# Patient Record
Sex: Female | Born: 1985
Health system: Southern US, Community
[De-identification: ages and names within clinical notes are randomized; demographics above are authoritative.]

## PROBLEM LIST (undated history)

## (undated) DIAGNOSIS — I10 Essential (primary) hypertension: Secondary | ICD-10-CM

---

## 2019-03-10 ENCOUNTER — Ambulatory Visit: Payer: No Typology Code available for payment source | Admitting: Family Medicine

## 2019-03-22 ENCOUNTER — Ambulatory Visit (INDEPENDENT_AMBULATORY_CARE_PROVIDER_SITE_OTHER): Payer: No Typology Code available for payment source | Admitting: Family Medicine

## 2019-03-22 ENCOUNTER — Encounter: Payer: Self-pay | Admitting: Family Medicine

## 2019-03-22 DIAGNOSIS — Z7182 Exercise counseling: Secondary | ICD-10-CM | POA: Diagnosis not present

## 2019-03-22 DIAGNOSIS — Z713 Dietary counseling and surveillance: Secondary | ICD-10-CM | POA: Diagnosis not present

## 2019-03-22 DIAGNOSIS — Z0000 Encounter for general adult medical examination without abnormal findings: Secondary | ICD-10-CM

## 2019-03-22 NOTE — Progress Notes (Signed)
   Chief Complaint:  Janice Matthews is a 33 y.o. female who presents today for a virtual office visit to establish care.  Assessment/Plan:  Preventative Healthcare Discussed preventive health care including diet, exercise, nutrition, activity, sick care, and cancer screening.    Subjective:  HPI:  Patient has no concerns today.  Her medical history is significant for 2 normal pregnancies.  She tries to be active and tries to walk every day.  She is also very active with her toddlers.  She tries to eat overall healthy diet with plenty of fruits and vegetables.  ROS: No reported cough.       Objective/Observations  Physical Exam: Gen: NAD, resting comfortably Pulm: Normal work of breathing Neuro: Grossly normal, moves all extremities Psych: Normal affect and thought content  Virtual Visit via Video   I connected with Cheyenne Adas on 03/22/19 at  9:20 AM EDT by a video enabled telemedicine application and verified that I am speaking with the correct person using two identifiers. I discussed the limitations of evaluation and management by telemedicine and the availability of in person appointments. The patient expressed understanding and agreed to proceed.   Patient location: Home Provider location: [place] participating in the virtual visit: Myself and Patient  Time Spent: I spent 20 minutes on this patient encounter, with more than half spent on counseling for preventative health care.      Algis Greenhouse. Jerline Pain, MD 03/22/2019 9:30 AM

## 2019-06-03 NOTE — L&D Delivery Note (Signed)
Delivery Note Pt progressed steadily through labor, using fentanyl x1 for pain control.  After about a 20  Minute 2nd stage, at 1:39 AM a viable female was delivered via Vaginal, Spontaneous (Presentation: Left Occiput Anterior). The shoulders were not forthcoming, so the posterior (right) axilla was grasped with my index finger, and the baby was rotated clockwise into the oblique diameter.  At this point, the (now) anterior shoulder was released, and the baby delivered.  At no time was any traction placed on the baby's head.    APGAR: 8, 9; weight pending. After 1 minute, the cord was clamped and cut. 40 units of pitocin diluted in 1000cc LR was infused rapidly IV.  The placenta separated spontaneously and delivered via CCT and maternal pushing effort.  It was inspected and appears to be intact with a 3 VC.   Anesthesia: None Episiotomy: None Lacerations: None Suture Repair:  Est. Blood Loss (mL): 150  Mom to postpartum.  Baby to Couplet care / Skin to Skin.  Janice Matthews 02/24/2020, 2:12 AM

## 2019-07-07 ENCOUNTER — Other Ambulatory Visit: Payer: Self-pay

## 2019-07-08 ENCOUNTER — Encounter: Payer: Self-pay | Admitting: Family Medicine

## 2019-07-08 ENCOUNTER — Ambulatory Visit (INDEPENDENT_AMBULATORY_CARE_PROVIDER_SITE_OTHER): Payer: No Typology Code available for payment source | Admitting: Family Medicine

## 2019-07-08 VITALS — BP 122/74 | HR 95 | Temp 98.0°F | Ht 65.0 in | Wt 154.2 lb

## 2019-07-08 DIAGNOSIS — O210 Mild hyperemesis gravidarum: Secondary | ICD-10-CM

## 2019-07-08 DIAGNOSIS — Z3490 Encounter for supervision of normal pregnancy, unspecified, unspecified trimester: Secondary | ICD-10-CM

## 2019-07-08 DIAGNOSIS — N926 Irregular menstruation, unspecified: Secondary | ICD-10-CM | POA: Insufficient documentation

## 2019-07-08 LAB — POCT URINE PREGNANCY: Preg Test, Ur: POSITIVE — AB

## 2019-07-08 NOTE — Patient Instructions (Signed)
It was very nice to see you today!  Congratulations!  Please let us know if you need any further assistance.   Take care, Dr Jimmey Ralph  Please try these tips to maintain a healthy lifestyle:   Eat at least 3 REAL meals and 1-2 snacks per day.  Aim for no more than 5 hours between eating.  If you eat breakfast, please do so within one hour of getting up.    Each meal should contain half fruits/vegetables, one quarter protein, and one quarter carbs (no bigger than a computer mouse)   Cut down on sweet beverages. This includes juice, soda, and sweet tea.     Drink at least 1 glass of water with each meal and aim for at least 8 glasses per day   Exercise at least 150 minutes every week.

## 2019-07-08 NOTE — Progress Notes (Signed)
   Janice Matthews is a 34 y.o. female who presents today for an office visit.  Assessment/Plan:  Pregnancy / Morning Sickness Urine pregnancy test positive.  Has follow-up with midwives already scheduled.  She will continue taking Unisom for her morning sickness.    Subjective:  HPI:  Patient is here today to confirm pregnancy. Thinks that she is about [redacted] weeks along.  Has been having some morning sickness and food aversion.  Also some fatigue.  Is been taking Unisom which is helped with morning sickness.  Will be following up with midwives.  Has had 2 previous normal pregnancies.       Objective:  Physical Exam: BP 122/74   Pulse 95   Temp 98 F (36.7 C)   Ht 5\' 5"  (1.651 m)   Wt 154 lb 4 oz (70 kg)   SpO2 99%   BMI 25.67 kg/m   Gen: No acute distress, resting comfortably Psych: Normal affect and thought content      Thermon Zulauf M. , MD 07/08/2019 8:28 AM

## 2019-07-25 ENCOUNTER — Ambulatory Visit (INDEPENDENT_AMBULATORY_CARE_PROVIDER_SITE_OTHER): Payer: No Typology Code available for payment source | Admitting: *Deleted

## 2019-07-25 ENCOUNTER — Other Ambulatory Visit: Payer: Self-pay

## 2019-07-25 VITALS — Wt 150.0 lb

## 2019-07-25 DIAGNOSIS — Z3400 Encounter for supervision of normal first pregnancy, unspecified trimester: Secondary | ICD-10-CM

## 2019-07-25 DIAGNOSIS — Z3480 Encounter for supervision of other normal pregnancy, unspecified trimester: Secondary | ICD-10-CM | POA: Insufficient documentation

## 2019-07-25 MED ORDER — BLOOD PRESSURE MONITOR AUTOMAT DEVI: 1.0000 | Freq: Every day | 0 refills | Status: DC

## 2019-07-25 NOTE — Progress Notes (Signed)
  Virtual Visit via Telephone Note  I connected with Janice Matthews on 07/25/19 at  1:30 PM EST by telephone and verified that I am speaking with the correct person using two identifiers.  Location: Patient: Janice Matthews MRN: [id number] Provider: Clovis Pu, RN   I discussed the limitations, risks, security and privacy concerns of performing an evaluation and management service by telephone and the availability of in person appointments. I also discussed with the patient that there may be a patient responsible charge related to this service. The patient expressed understanding and agreed to proceed.   History of Present Illness: PRENATAL INTAKE SUMMARY  Janice Matthews presents today New OB Nurse Interview.  OB History    Gravida  3   Para  2   Term  2   Preterm      AB      Living  2     SAB      TAB      Ectopic      Multiple      Live Births  2          I have reviewed the patient's medical, obstetrical, social, and family histories, medications, and available lab results.  SUBJECTIVE She has no unusual complaints. Reported history of PIH and taking ASA.   Observations/Objective: Initial nurse interview for history/labs (New OB)  EDD: 02/23/2020 by LMP GA: [redacted]w[redacted]d G3P2002 FHT: non face to face interview  GENERAL APPEARANCE: non face to face interview  Assessment and Plan: Normal pregnancy Prenatal care- [facility] Labs to be completed at next visit with midwife Patient to sign up for Babyscripts Rx for BP monitor sent to [facility] Continue PNV Next appointment 08/05/2019  Follow Up Instructions:   I discussed the assessment and treatment plan with the patient. The patient was provided an opportunity to ask questions and all were answered. The patient agreed with the plan and demonstrated an understanding of the instructions.   The patient was advised to call back or seek an in-person evaluation if the symptoms worsen or if the  condition fails to improve as anticipated.  I provided 15 minutes of non-face-to-face time during this encounter.   Clovis Pu, RN

## 2019-08-05 ENCOUNTER — Other Ambulatory Visit: Payer: Self-pay

## 2019-08-05 ENCOUNTER — Inpatient Hospital Stay (HOSPITAL_COMMUNITY)
Admission: AD | Admit: 2019-08-05 | Discharge: 2019-08-05 | Disposition: A | Discharge status: Home / Self Care | Payer: No Typology Code available for payment source | Attending: Obstetrics & Gynecology | Admitting: Obstetrics & Gynecology

## 2019-08-05 ENCOUNTER — Telehealth: Payer: Self-pay

## 2019-08-05 ENCOUNTER — Inpatient Hospital Stay (HOSPITAL_COMMUNITY): Payer: No Typology Code available for payment source

## 2019-08-05 ENCOUNTER — Encounter (HOSPITAL_COMMUNITY): Payer: Self-pay | Admitting: Obstetrics & Gynecology

## 2019-08-05 ENCOUNTER — Ambulatory Visit (INDEPENDENT_AMBULATORY_CARE_PROVIDER_SITE_OTHER): Payer: No Typology Code available for payment source

## 2019-08-05 VITALS — BP 127/89 | HR 71 | Wt 153.8 lb

## 2019-08-05 DIAGNOSIS — Z3A11 11 weeks gestation of pregnancy: Secondary | ICD-10-CM

## 2019-08-05 DIAGNOSIS — O358XX0 Maternal care for other (suspected) fetal abnormality and damage, not applicable or unspecified: Secondary | ICD-10-CM

## 2019-08-05 DIAGNOSIS — Z8759 Personal history of other complications of pregnancy, childbirth and the puerperium: Secondary | ICD-10-CM | POA: Insufficient documentation

## 2019-08-05 DIAGNOSIS — Z3480 Encounter for supervision of other normal pregnancy, unspecified trimester: Secondary | ICD-10-CM

## 2019-08-05 DIAGNOSIS — K5900 Constipation, unspecified: Secondary | ICD-10-CM

## 2019-08-05 DIAGNOSIS — O368310 Maternal care for abnormalities of the fetal heart rate or rhythm, first trimester, not applicable or unspecified: Secondary | ICD-10-CM | POA: Insufficient documentation

## 2019-08-05 DIAGNOSIS — IMO0002 Reserved for concepts with insufficient information to code with codable children: Secondary | ICD-10-CM

## 2019-08-05 DIAGNOSIS — O368390 Maternal care for abnormalities of the fetal heart rate or rhythm, unspecified trimester, not applicable or unspecified: Secondary | ICD-10-CM

## 2019-08-05 DIAGNOSIS — O99611 Diseases of the digestive system complicating pregnancy, first trimester: Secondary | ICD-10-CM

## 2019-08-05 HISTORY — DX: Essential (primary) hypertension: I10

## 2019-08-05 LAB — CBC
HCT: 40.1 % (ref 36.0–46.0)
Hemoglobin: 14.3 g/dL (ref 12.0–15.0)
MCH: 30.4 pg (ref 26.0–34.0)
MCHC: 35.7 g/dL (ref 30.0–36.0)
MCV: 85.1 fL (ref 80.0–100.0)
Platelets: 307 10*3/uL (ref 150–400)
RBC: 4.71 MIL/uL (ref 3.87–5.11)
RDW: 12.3 % (ref 11.5–15.5)
WBC: 8.7 10*3/uL (ref 4.0–10.5)
nRBC: 0 % (ref 0.0–0.2)

## 2019-08-05 LAB — COMPREHENSIVE METABOLIC PANEL
ALT: 16 U/L (ref 0–44)
AST: 18 U/L (ref 15–41)
Albumin: 3.8 g/dL (ref 3.5–5.0)
Alkaline Phosphatase: 59 U/L (ref 38–126)
Anion gap: 10 (ref 5–15)
BUN: 8 mg/dL (ref 6–20)
CO2: 24 mmol/L (ref 22–32)
Calcium: 9.4 mg/dL (ref 8.9–10.3)
Chloride: 102 mmol/L (ref 98–111)
Creatinine, Ser: 0.76 mg/dL (ref 0.44–1.00)
GFR calc Af Amer: >60 mL/min (ref >60)
GFR calc non Af Amer: >60 mL/min (ref >60)
Glucose, Bld: 99 mg/dL (ref 70–99)
Potassium: 3.9 mmol/L (ref 3.5–5.1)
Sodium: 136 mmol/L (ref 135–145)
Total Bilirubin: 0.5 mg/dL (ref 0.3–1.2)
Total Protein: 6.2 g/dL — ABNORMAL LOW (ref 6.5–8.1)

## 2019-08-05 LAB — ABO/RH: ABO/RH(D): O POS

## 2019-08-05 LAB — HCG, QUANTITATIVE, PREGNANCY: hCG, Beta Chain, Quant, S: 117137 m[IU]/mL — ABNORMAL HIGH (ref <5)

## 2019-08-05 MED ORDER — POLYETHYLENE GLYCOL 3350 17 G PO PACK: 17.0000 g | PACK | Freq: Every day | ORAL | 2 refills | Status: DC | PRN

## 2019-08-05 MED ORDER — ASPIRIN EC 81 MG PO TBEC: 81.0000 mg | DELAYED_RELEASE_TABLET | Freq: Every day | ORAL | 2 refills | Status: DC

## 2019-08-05 NOTE — Telephone Encounter (Signed)
Attempted to call patient regarding follow up and completion of NOB labs after US reveals SIUP with dates c/w LMP. Phone straight to voicemail and no message left.  Will send mychart message to patient and staff.  Cherre Robins MSN, CNM Advanced Practice Provider, [facility] [employer] [facility]

## 2019-08-05 NOTE — Progress Notes (Signed)
Last pap 3 years ago- normal

## 2019-08-05 NOTE — Progress Notes (Signed)
Subjective:   Janice Matthews is a 34 y.o. G3P2002 at [redacted]w[redacted]d by Definite LMP of 05/18/2020 is being seen today for her first obstetrical visit. She denies any significant medical history and her obstetrical history is significant for pregnancy induced hypertension at 39 weeks during her first pregnancy.  She reports she was on low-dose baby aspirin for her 2nd pregnancy and did not have any complications.  She does report that baby was post-dates and she was induced.  Patient expresses desire for spontaneous labor with this pregnancy! Pregnancy history fully reviewed. Patient does intend to breast feed.   Patient requests provider recommendation regarding Covid vaccine during pregnancy. Patient states her last appt (2/22) she gave the wrong weight and instead of 142lbs, it should be 150lbs.    Patient reports some nausea and fatigue that has resolved with spontaneously, but was being managed with 1/2 tablet of unisom.  However patient reports some lingering results that resulted in discontinuation.  Patient reports some constipation and requests miralax.  She states she has bowel movements every 2-3 days that are easy to pass.   Patient denies urinary and vaginal concerns.  Patient reports her last pap was between 3-5 years ago, but was normal.    Patient is married to [place] who is her support person.  Patient is from [place] and reports she has no family in [place] other than her husband and two small children.    HISTORY: OB History  Gravida Para Term Preterm AB Living  3 2 2  0 0 2  SAB TAB Ectopic Multiple Live Births  0 0 0 0 2    # Outcome Date GA Lbr Len/2nd Weight Sex Delivery Anes PTL Lv  3 Current           2 Term 12/20/16 [redacted]w[redacted]d  8 lb (3.629 kg) F Vag-Spont  N LIV  1 Term 10/16/14 [redacted]w[redacted]d  8 lb 2 oz (3.685 kg) M Vag-Spont  N LIV    Last pap smear was done 3-5 years ago and was normal  No past medical history on file. No past surgical history on file. Family History    Problem Relation Age of Onset  . Diabetes Mother   . Thyroid disease Mother    Social History   Tobacco Use  . Smoking status: Never Smoker  . Smokeless tobacco: Never Used  Substance Use Topics  . Alcohol use: Yes    Comment: occ  . Drug use: Never   No Known Allergies Current Outpatient Medications on File Prior to Visit  Medication Sig Dispense Refill  . Blood Pressure Monitoring (BLOOD PRESSURE MONITOR AUTOMAT) DEVI 1 Device by Does not apply route daily. Automatic blood pressure cuff regular size. To monitor blood pressure regularly at home. ICD-10 code: O09.90 1 each 0  . Prenatal Vit-Fe Fumarate-FA (MULTIVITAMIN-PRENATAL) 27-0.8 MG TABS tablet Take 1 tablet by mouth daily at 12 noon.     No current facility-administered medications on file prior to visit.    Review of Systems Pertinent items noted in HPI and remainder of comprehensive ROS otherwise negative.  Exam   Vitals:   08/05/19 0948  BP: 127/89  Pulse: 71  Weight: 153 lb 12.8 oz (69.8 kg)      Physical Exam Constitutional:      Appearance: Normal appearance.  HENT:     Head: Normocephalic and atraumatic.  Eyes:     Conjunctiva/sclera: Conjunctivae normal.  Neck:     Trachea: Trachea normal.  Cardiovascular:  Rate and Rhythm: Regular rhythm.  Pulmonary:     Effort: Pulmonary effort is normal. No respiratory distress.  Abdominal:     General: Abdomen is flat.     Palpations: Abdomen is soft.     Tenderness: There is no abdominal tenderness.  Musculoskeletal:        General: Normal range of motion.     Cervical back: Normal range of motion.  Neurological:     Mental Status: She is alert and oriented to person, place, and time.  Skin:    General: Skin is warm and dry.  Psychiatric:        Mood and Affect: Mood normal.        Behavior: Behavior normal.        Thought Content: Thought content normal.     Assessment:   34 y.o. year old G3P2002 Patient Active Problem List   Diagnosis  Date Noted  . History of gestational hypertension 08/05/2019  . Supervision of other normal pregnancy, antepartum 07/25/2019     Plan:  1. Supervision of other normal pregnancy, antepartum -Congratulations given and patient welcomed to practice. -Questions and concerns addressed. -Informed that research has just began for Covid vaccinations in pregnancy.  Provider has no formal recommendation regarding vaccination.  Encouraged to continue social distancing, masking, and limiting exposure to others as much as possible.  -Reviewed hospital protocol for -Discussed usage of Babyscripts and virtual visits as additional source of managing and completing PN visits in midst of coronavirus.   -Anticipatory guidance for prenatal visits including labs, ultrasounds, and testing -Genetic Screening discussed, NIPS: requested. -Encouraged to complete MyChart Registration for her ability to review results, send requests, and have questions addressed.  -Discussed estimated due date of Sept 23, 2021. -Ultrasound discussed; fetal anatomic survey: ordered. -Encouraged to seek out care at office or emergency room for urgent and/or emergent concerns. -Educated on the nature of [place] [facility] with multiple MDs and other Advanced Practice Providers was explained to patient; also emphasized that residents, students are part of our team. Informed of her right to refuse care as she deems appropriate.  -No questions or concerns.   2. History of gestational hypertension -Discussed initiation of low dose aspirin in one week for history of GHTN. -Patient has BP cuff at home.   3. Constipation during pregnancy in first trimester -Rx for Miralax sent to pharmacy on file.   4. Absence of fetal heart activity -Attempted by CMA without success. -Provider attempted by doppler without success. -BSUS performed and no fetus visualized. -Patient informed of provider inability to visualize  uterine contents including anticipated fetus.  -Patient given option to complete physical exam or report to [facility] for further evaluation. -Patient opts to go to [facility] -S. Reita Cliche, CNM contacted and informed of patient status and anticipated arrival.  -Patient informed that provider would review chart and coordinate care as appropriate.   Problem list reviewed and updated. Routine obstetric precautions reviewed.  Orders Placed This Encounter  Procedures  . [place] MFM OB DETAIL +14 WK    Standing Status:   Future    Standing Expiration Date:   10/04/2020    Order Specific Question:   Reason for Exam (SYMPTOM  OR DIAGNOSIS REQUIRED)    Answer:   Anatomy    Order Specific Question:   Preferred Location    Answer:   [facility]    No follow-ups on file.  Cherre Robins, CNM 08/05/2019 9:55 AM  Addendum (11:31 AM) All OB labs cancelled. Medications remain at pharmacy. MFM [place] cancelled.

## 2019-08-05 NOTE — Discharge Instructions (Signed)
First Trimester of Pregnancy  The first trimester of pregnancy is from week 1 until the end of week 13 (months 1 through 3). During this time, your baby will begin to develop inside you. At 6-8 weeks, the eyes and face are formed, and the heartbeat can be seen on ultrasound. At the end of 12 weeks, all the baby's organs are formed. Prenatal care is all the medical care you receive before the birth of your baby. Make sure you get good prenatal care and follow all of your doctor's instructions. Follow these instructions at home: Medicines  Take over-the-counter and prescription medicines only as told by your doctor. Some medicines are safe and some medicines are not safe during pregnancy.  Take a prenatal vitamin that contains at least 600 micrograms (mcg) of folic acid.  If you have trouble pooping (constipation), take medicine that will make your stool soft (stool softener) if your doctor approves. Eating and drinking   Eat regular, healthy meals.  Your doctor will tell you the amount of weight gain that is right for you.  Avoid raw meat and uncooked cheese.  If you feel sick to your stomach (nauseous) or throw up (vomit): ? Eat 4 or 5 small meals a day instead of 3 large meals. ? Try eating a few soda crackers. ? Drink liquids between meals instead of during meals.  To prevent constipation: ? Eat foods that are high in fiber, like fresh fruits and vegetables, whole grains, and beans. ? Drink enough fluids to keep your pee (urine) clear or pale yellow. Activity  Exercise only as told by your doctor. Stop exercising if you have cramps or pain in your lower belly (abdomen) or low back.  Do not exercise if it is too hot, too humid, or if you are in a place of great height (high altitude).  Try to avoid standing for long periods of time. Move your legs often if you must stand in one place for a long time.  Avoid heavy lifting.  Wear low-heeled shoes. Sit and stand up  straight.  You can have sex unless your doctor tells you not to. Relieving pain and discomfort  Wear a good support bra if your breasts are sore.  Take warm water baths (sitz baths) to soothe pain or discomfort caused by hemorrhoids. Use hemorrhoid cream if your doctor says it is okay.  Rest with your legs raised if you have leg cramps or low back pain.  If you have puffy, bulging veins (varicose veins) in your legs: ? Wear support hose or compression stockings as told by your doctor. ? Raise (elevate) your feet for 15 minutes, 3-4 times a day. ? Limit salt in your food. Prenatal care  Schedule your prenatal visits by the twelfth week of pregnancy.  Write down your questions. Take them to your prenatal visits.  Keep all your prenatal visits as told by your doctor. This is important. Safety  Wear your seat belt at all times when driving.  Make a list of emergency phone numbers. The list should include numbers for family, friends, the hospital, and police and fire departments. General instructions  Ask your doctor for a referral to a local prenatal class. Begin classes no later than at the start of month 6 of your pregnancy.  Ask for help if you need counseling or if you need help with nutrition. Your doctor can give you advice or tell you where to go for help.  Do not use hot tubs, steam   rooms, or saunas.  Do not douche or use tampons or scented sanitary pads.  Do not cross your legs for long periods of time.  Avoid all herbs and alcohol. Avoid drugs that are not approved by your doctor.  Do not use any tobacco products, including cigarettes, chewing tobacco, and electronic cigarettes. If you need help quitting, ask your doctor. You may get counseling or other support to help you quit.  Avoid cat litter boxes and soil used by cats. These carry germs that can cause birth defects in the baby and can cause a loss of your baby (miscarriage) or stillbirth.  Visit your dentist.  At home, brush your teeth with a soft toothbrush. Be gentle when you floss. Contact a doctor if:  You are dizzy.  You have mild cramps or pressure in your lower belly.  You have a nagging pain in your belly area.  You continue to feel sick to your stomach, you throw up, or you have watery poop (diarrhea).  You have a bad smelling fluid coming from your vagina.  You have pain when you pee (urinate).  You have increased puffiness (swelling) in your face, hands, legs, or ankles. Get help right away if:  You have a fever.  You are leaking fluid from your vagina.  You have spotting or bleeding from your vagina.  You have very bad belly cramping or pain.  You gain or lose weight rapidly.  You throw up blood. It may look like coffee grounds.  You are around people who have German measles, fifth disease, or chickenpox.  You have a very bad headache.  You have shortness of breath.  You have any kind of trauma, such as from a fall or a car accident. Summary  The first trimester of pregnancy is from week 1 until the end of week 13 (months 1 through 3).  To take care of yourself and your unborn baby, you will need to eat healthy meals, take medicines only if your doctor tells you to do so, and do activities that are safe for you and your baby.  Keep all follow-up visits as told by your doctor. This is important as your doctor will have to ensure that your baby is healthy and growing well. This information is not intended to replace advice given to you by your health care provider. Make sure you discuss any questions you have with your health care provider. Document Revised: 09/09/2018 Document Reviewed: 05/27/2016 Elsevier Patient Education  2020 [employer] Inc[employer]  Safe Medications in Pregnancy   Acne: Benzoyl Peroxide Salicylic Acid  Backache/Headache: Tylenol: 2 regular strength every 4 hours OR              2 Extra strength every 6  hours  Colds/Coughs/Allergies: Benadryl (alcohol free) 25 mg every 6 hours as needed Breath right strips Claritin Cepacol throat lozenges Chloraseptic throat spray Cold-Eeze- up to three times per day Cough drops, alcohol free Flonase (by prescription only) Guaifenesin Mucinex Robitussin DM (plain only, alcohol free) Saline nasal spray/drops Sudafed (pseudoephedrine) & Actifed ** use only after [redacted] weeks gestation and if you do not have high blood pressure Tylenol Vicks Vaporub Zinc lozenges Zyrtec   Constipation: Colace Ducolax suppositories Fleet enema Glycerin suppositories Metamucil Milk of magnesia Miralax Senokot Smooth move tea  Diarrhea: Kaopectate Imodium A-D  *NO pepto Bismol  Hemorrhoids: Anusol Anusol HC Preparation H Tucks  Indigestion: Tums Maalox Mylanta Zantac  Pepcid  Insomnia: Benadryl (alcohol free) 25mg every 6 hours as needed   Tylenol PM Unisom, no Gelcaps  Leg Cramps: Tums MagGel  Nausea/Vomiting:  Bonine Dramamine Emetrol Ginger extract Sea bands Meclizine  Nausea medication to take during pregnancy:  Unisom (doxylamine succinate 25 mg tablets) Take one tablet daily at bedtime. If symptoms are not adequately controlled, the dose can be increased to a maximum recommended dose of two tablets daily (1/2 tablet in the morning, 1/2 tablet mid-afternoon and one at bedtime). Vitamin B6 100mg tablets. Take one tablet twice a day (up to 200 mg per day).  Skin Rashes: Aveeno products Benadryl cream or 25mg every 6 hours as needed Calamine Lotion 1% cortisone cream  Yeast infection: Gyne-lotrimin 7 Monistat 7   **If taking multiple medications, please check labels to avoid duplicating the same active ingredients **take medication as directed on the label ** Do not exceed 4000 mg of tylenol in 24 hours **Do not take medications that contain aspirin or ibuprofen   

## 2019-08-05 NOTE — MAU Provider Note (Signed)
History     CSN: [id number]  Arrival date and time: 08/05/19 1053  First Provider Initiated Contact with Patient 08/05/19 1117     Chief Complaint  Patient presents with  . no fetal tones   HPI Janice Matthews is a 34 y.o. G3P2002 at [redacted]w[redacted]d who presents to MAU from [facility] for evaluation of absent fetal heart tones on doppler and informal bedside ultrasound. Patient denies pain or bleeding.   Ob history includes term SVD x 2.  OB History    Gravida  3   Para  2   Term  2   Preterm      AB      Living  2     SAB      TAB      Ectopic      Multiple      Live Births  2          Patient Active Problem List   Diagnosis Date Noted  . History of gestational hypertension 08/05/2019  . Supervision of other normal pregnancy, antepartum 07/25/2019   Past Medical History:  Diagnosis Date  . Hypertension     History reviewed. No pertinent surgical history.  Family History  Problem Relation Age of Onset  . Diabetes Mother   . Thyroid disease Mother     Social History   Tobacco Use  . Smoking status: Never Smoker  . Smokeless tobacco: Never Used  Substance Use Topics  . Alcohol use: Yes    Comment: occ  . Drug use: Never    Allergies: No Known Allergies  Medications Prior to Admission  Medication Sig Dispense Refill Last Dose  . Prenatal Vit-Fe Fumarate-FA (MULTIVITAMIN-PRENATAL) 27-0.8 MG TABS tablet Take 1 tablet by mouth daily at 12 noon.   08/04/2019 at Unknown time  . aspirin EC 81 MG tablet Take 1 tablet (81 mg total) by mouth daily. 60 tablet 2   . Blood Pressure Monitoring (BLOOD PRESSURE MONITOR AUTOMAT) DEVI 1 Device by Does not apply route daily. Automatic blood pressure cuff regular size. To monitor blood pressure regularly at home. ICD-10 code: O09.90 1 each 0   . polyethylene glycol (MIRALAX) 17 g packet Take 17 g by mouth daily as needed for mild constipation or moderate constipation. 14 each 2     Review of Systems   Constitutional: Positive for fatigue.  Gastrointestinal: Positive for nausea and vomiting. Negative for abdominal pain.  Genitourinary: Negative for dysuria, flank pain, vaginal bleeding and vaginal discharge.  Musculoskeletal: Negative for back pain.  All other systems reviewed and are negative.  Physical Exam   Blood pressure (!) 143/84, pulse (!) 121, temperature 98.9 F (37.2 C), temperature source Oral, resp. rate 16, height 5\' 5"  (1.651 m), weight 69.8 kg, last menstrual period 05/19/2019, SpO2 100 %.  Physical Exam  Nursing note and vitals reviewed. Constitutional: She is oriented to person, place, and time. She appears well-developed and well-nourished.  Cardiovascular: Normal rate.  Respiratory: Effort normal and breath sounds normal.  GI: Soft. Bowel sounds are normal. She exhibits no distension. There is no abdominal tenderness. There is no rebound and no guarding.  Musculoskeletal:        General: Normal range of motion.  Neurological: She is alert and oriented to person, place, and time.  Skin: Skin is warm and dry.  Psychiatric: She has a normal mood and affect. Her behavior is normal. Judgment and thought content normal.    MAU Course  Procedures  Patient Vitals for the past 24 hrs:  BP Temp Temp src Pulse Resp SpO2 Height Weight  08/05/19 1231 122/87 -- -- -- -- -- -- --  08/05/19 1101 (!) 143/84 98.9 F (37.2 C) Oral (!) 121 16 100 % 5\' 5"  (1.651 m) 69.8 kg   Results for orders placed or performed during the hospital encounter of 08/05/19 (from the past 24 hour(s))  CBC     Status: None   Collection Time: 08/05/19 11:41 AM  Result Value Ref Range   WBC 8.7 4.0 - 10.5 K/uL   RBC 4.71 3.87 - 5.11 MIL/uL   Hemoglobin 14.3 12.0 - 15.0 g/dL   HCT 10/05/19 09.2 - 33.0 %   MCV 85.1 80.0 - 100.0 fL   MCH 30.4 26.0 - 34.0 pg   MCHC 35.7 30.0 - 36.0 g/dL   RDW 07.6 22.6 - 33.3 %   Platelets 307 150 - 400 K/uL   nRBC 0.0 0.0 - 0.2 %  Comprehensive metabolic panel      Status: Abnormal   Collection Time: 08/05/19 11:41 AM  Result Value Ref Range   Sodium 136 135 - 145 mmol/L   Potassium 3.9 3.5 - 5.1 mmol/L   Chloride 102 98 - 111 mmol/L   CO2 24 22 - 32 mmol/L   Glucose, Bld 99 70 - 99 mg/dL   BUN 8 6 - 20 mg/dL   Creatinine, Ser 10/05/19 0.44 - 1.00 mg/dL   Calcium 9.4 8.9 - 6.25 mg/dL   Total Protein 6.2 (L) 6.5 - 8.1 g/dL   Albumin 3.8 3.5 - 5.0 g/dL   AST 18 15 - 41 U/L   ALT 16 0 - 44 U/L   Alkaline Phosphatase 59 38 - 126 U/L   Total Bilirubin 0.5 0.3 - 1.2 mg/dL   GFR calc non Af Amer >60 >60 mL/min   GFR calc Af Amer >60 >60 mL/min   Anion gap 10 5 - 15  ABO/Rh     Status: None   Collection Time: 08/05/19 11:41 AM  Result Value Ref Range   ABO/RH(D) O POS    No rh immune globuloin      NOT A RH IMMUNE GLOBULIN CANDIDATE, PT RH POSITIVE Performed at [facility], [address] [place], [place]   hCG, quantitative, pregnancy     Status: Abnormal   Collection Time: 08/05/19 11:41 AM  Result Value Ref Range   hCG, Beta Chain, Quant, S 117,137 (H) <5 mIU/mL   10/05/19 OB Comp Less 14 Wks  Result Date: 08/05/2019 CLINICAL DATA:  Pregnancy.  Difficulty evaluating fetal heart tones EXAM: OBSTETRIC <14 WK ULTRASOUND TECHNIQUE: Transabdominal ultrasound was performed for evaluation of the gestation as well as the maternal uterus and adnexal regions. COMPARISON:  None. FINDINGS: Intrauterine gestational sac: Single Yolk sac:  Visualized. Embryo:  Visualized. Cardiac Activity: Visualized. Heart Rate: 157 bpm CRL:   50.7 mm   11 w 5 d                  10/05/2019 EDC: 02/19/2020 Subchorionic hemorrhage:  None visualized. Maternal uterus/adnexae: Unremarkable bilateral ovaries. No free fluid within the pelvis. IMPRESSION: 1. Single live intrauterine gestation measuring 11 weeks 5 days by crown-rump length. 2. Active fetal heart tones at 157 beats per minute. Electronically Signed   By: 02/21/2020 D.O.   On: 08/05/2019 12:23   Assessment and  Plan  --34 y.o. G3P2002 at [redacted]w[redacted]d  --Reassuring fetal surveillance on imaging  today --Discharge home in stable condition with first trimester precautions  F/U: --S/p New OB appointment at [facility] this morning  Darlina Rumpf, CNM 08/05/2019, 2:07 PM

## 2019-08-05 NOTE — MAU Note (Signed)
Sent in from office visit, no FH w/doppler.

## 2019-08-09 ENCOUNTER — Encounter: Payer: Self-pay | Admitting: General Practice

## 2019-08-09 ENCOUNTER — Other Ambulatory Visit (INDEPENDENT_AMBULATORY_CARE_PROVIDER_SITE_OTHER): Payer: No Typology Code available for payment source | Admitting: *Deleted

## 2019-08-09 ENCOUNTER — Other Ambulatory Visit: Payer: Self-pay

## 2019-08-09 DIAGNOSIS — Z113 Encounter for screening for infections with a predominantly sexual mode of transmission: Secondary | ICD-10-CM

## 2019-08-09 DIAGNOSIS — Z3480 Encounter for supervision of other normal pregnancy, unspecified trimester: Secondary | ICD-10-CM

## 2019-08-09 NOTE — Progress Notes (Signed)
   Patient in clinic for new ob labs.  Clovis Pu, RN

## 2019-08-10 LAB — OBSTETRIC PANEL, INCLUDING HIV
Antibody Screen: NEGATIVE
Basophils Absolute: 0 10*3/uL (ref 0.0–0.2)
Basos: 1 %
EOS (ABSOLUTE): 0.1 10*3/uL (ref 0.0–0.4)
Eos: 1 %
HIV Screen 4th Generation wRfx: NONREACTIVE
Hematocrit: 40.5 % (ref 34.0–46.6)
Hemoglobin: 14.2 g/dL (ref 11.1–15.9)
Hepatitis B Surface Ag: NEGATIVE
Immature Grans (Abs): 0 10*3/uL (ref 0.0–0.1)
Immature Granulocytes: 0 %
Lymphocytes Absolute: 2 10*3/uL (ref 0.7–3.1)
Lymphs: 26 %
MCH: 31.2 pg (ref 26.6–33.0)
MCHC: 35.1 g/dL (ref 31.5–35.7)
MCV: 89 fL (ref 79–97)
Monocytes Absolute: 0.5 10*3/uL (ref 0.1–0.9)
Monocytes: 6 %
Neutrophils Absolute: 5.1 10*3/uL (ref 1.4–7.0)
Neutrophils: 66 %
Platelets: 319 10*3/uL (ref 150–450)
RBC: 4.55 x10E6/uL (ref 3.77–5.28)
RDW: 13.1 % (ref 11.7–15.4)
RPR Ser Ql: NONREACTIVE
Rh Factor: POSITIVE
Rubella Antibodies, IGG: 2.55 index (ref >0.99)
WBC: 7.7 10*3/uL (ref 3.4–10.8)

## 2019-08-10 LAB — HEMOGLOBIN A1C
Est. average glucose Bld gHb Est-mCnc: 103 mg/dL
Hgb A1c MFr Bld: 5.2 % (ref 4.8–5.6)

## 2019-08-11 ENCOUNTER — Encounter: Payer: Self-pay | Admitting: General Practice

## 2019-08-11 LAB — URINE CYTOLOGY ANCILLARY ONLY
Chlamydia: NEGATIVE
Comment: NEGATIVE
Comment: NORMAL
Neisseria Gonorrhea: NEGATIVE

## 2019-08-11 LAB — URINE CULTURE, OB REFLEX

## 2019-08-11 LAB — CULTURE, OB URINE

## 2019-08-12 LAB — PROTEIN / CREATININE RATIO, URINE
Creatinine, Urine: 17.4 mg/dL
Protein, Ur: <4 mg/dL

## 2019-08-15 ENCOUNTER — Encounter: Payer: Self-pay | Admitting: General Practice

## 2019-08-19 ENCOUNTER — Encounter: Payer: Self-pay | Admitting: General Practice

## 2019-08-22 ENCOUNTER — Other Ambulatory Visit: Payer: No Typology Code available for payment source

## 2019-09-15 ENCOUNTER — Telehealth (INDEPENDENT_AMBULATORY_CARE_PROVIDER_SITE_OTHER): Payer: No Typology Code available for payment source | Admitting: Obstetrics and Gynecology

## 2019-09-15 ENCOUNTER — Encounter: Payer: Self-pay | Admitting: Obstetrics and Gynecology

## 2019-09-15 DIAGNOSIS — Z3482 Encounter for supervision of other normal pregnancy, second trimester: Secondary | ICD-10-CM

## 2019-09-15 DIAGNOSIS — Z3A17 17 weeks gestation of pregnancy: Secondary | ICD-10-CM

## 2019-09-15 DIAGNOSIS — Z3480 Encounter for supervision of other normal pregnancy, unspecified trimester: Secondary | ICD-10-CM

## 2019-09-15 NOTE — Progress Notes (Signed)
   MY CHART VIDEO VIRTUAL OBSTETRICS VISIT ENCOUNTER NOTE  I connected with Janice Matthews on 09/15/19 at  1:50 PM EDT by My Chart video at home and verified that I am speaking with the correct person using two identifiers.   I discussed the limitations, risks, security and privacy concerns of performing an evaluation and management service by My Chart video and the availability of in person appointments. I also discussed with the patient that there may be a patient responsible charge related to this service. The patient expressed understanding and agreed to proceed.  Subjective:  Janice Matthews is a 34 y.o. G3P2002 at [redacted]w[redacted]d being followed for ongoing prenatal care.  She is currently monitored for the following issues for this low-risk pregnancy and has Supervision of other normal pregnancy, antepartum and History of gestational hypertension on their problem list.  Patient reports no complaints. Reports fetal movement. Denies any contractions, bleeding or leaking of fluid.   The following portions of the patient's history were reviewed and updated as appropriate: allergies, current medications, past family history, past medical history, past social history, past surgical history and problem list.   Objective:   General:  Alert, oriented and cooperative.   Mental Status: Normal mood and affect perceived. Normal judgment and thought content.  Rest of physical exam deferred due to type of encounter  BP 95/66   Wt 154 lb 9.6 oz (70.1 kg)   LMP 05/19/2019 (Exact Date)   BMI 25.73 kg/m  **Done by patient's own at home BP cuff and scale  Assessment and Plan:  Pregnancy: G3P2002 at [redacted]w[redacted]d  1. Supervision of other normal pregnancy, antepartum - Anticipatory guidance for anatomy U/S, no visitors allowed for any U/S at this time.  - Reviewed normal labs - Discussed nv will be via MC at 24 wks, but call office if any problems or concerns arise before them - Will do pap at 36 wks d/t pelvic exam  at that visit already planned - Will closely monitor VS and s/sx's of PEC  Preterm labor symptoms and general obstetric precautions including but not limited to vaginal bleeding, contractions, leaking of fluid and fetal movement were reviewed in detail with the patient.  I discussed the assessment and treatment plan with the patient. The patient was provided an opportunity to ask questions and all were answered. The patient agreed with the plan and demonstrated an understanding of the instructions. The patient was advised to call back or seek an in-person office evaluation/go to MAU at [facility] for any urgent or concerning symptoms. Please refer to After Visit Summary for other counseling recommendations.   I provided 5 minutes of non-face-to-face time during this encounter. There was 5 minutes of chart review time spent prior to this encounter. Total time spent = 10 minutes.  Return in about 7 weeks (around 11/03/2019) for Return OB - My Chart video, then 2hr GTT @ 28 wks.  Future Appointments  Date Time Provider Department Center  09/26/2019  8:30 AM [facility] NURSE [facility]  09/26/2019  8:30 AM [facility] [place] 1 [facility]    Raelyn Mora, CNM Center for [employer], [facility]

## 2019-09-26 ENCOUNTER — Ambulatory Visit (HOSPITAL_COMMUNITY): Payer: No Typology Code available for payment source | Admitting: *Deleted

## 2019-09-26 ENCOUNTER — Other Ambulatory Visit: Payer: Self-pay

## 2019-09-26 ENCOUNTER — Other Ambulatory Visit (HOSPITAL_COMMUNITY): Payer: Self-pay | Admitting: *Deleted

## 2019-09-26 ENCOUNTER — Encounter (HOSPITAL_COMMUNITY): Payer: Self-pay

## 2019-09-26 ENCOUNTER — Ambulatory Visit (HOSPITAL_COMMUNITY)
Admission: RE | Admit: 2019-09-26 | Discharge: 2019-09-26 | Disposition: A | Discharge status: Home / Self Care | Payer: No Typology Code available for payment source | Source: Ambulatory Visit | Attending: Obstetrics and Gynecology | Admitting: Obstetrics and Gynecology

## 2019-09-26 VITALS — BP 119/77 | HR 105 | Temp 97.6°F

## 2019-09-26 DIAGNOSIS — Z363 Encounter for antenatal screening for malformations: Secondary | ICD-10-CM

## 2019-09-26 DIAGNOSIS — O358XX0 Maternal care for other (suspected) fetal abnormality and damage, not applicable or unspecified: Secondary | ICD-10-CM

## 2019-09-26 DIAGNOSIS — O0990 Supervision of high risk pregnancy, unspecified, unspecified trimester: Secondary | ICD-10-CM | POA: Diagnosis present

## 2019-09-26 DIAGNOSIS — Z3A18 18 weeks gestation of pregnancy: Secondary | ICD-10-CM

## 2019-09-26 DIAGNOSIS — O09292 Supervision of pregnancy with other poor reproductive or obstetric history, second trimester: Secondary | ICD-10-CM | POA: Diagnosis not present

## 2019-09-26 DIAGNOSIS — O350XX0 Maternal care for (suspected) central nervous system malformation in fetus, not applicable or unspecified: Secondary | ICD-10-CM

## 2019-09-26 DIAGNOSIS — Z3480 Encounter for supervision of other normal pregnancy, unspecified trimester: Secondary | ICD-10-CM | POA: Insufficient documentation

## 2019-09-26 DIAGNOSIS — O3503X0 Maternal care for (suspected) central nervous system malformation or damage in fetus, choroid plexus cysts, not applicable or unspecified: Secondary | ICD-10-CM

## 2019-09-27 DIAGNOSIS — IMO0002 Reserved for concepts with insufficient information to code with codable children: Secondary | ICD-10-CM | POA: Insufficient documentation

## 2019-09-27 DIAGNOSIS — O350XX0 Maternal care for (suspected) central nervous system malformation in fetus, not applicable or unspecified: Secondary | ICD-10-CM | POA: Insufficient documentation

## 2019-10-24 ENCOUNTER — Ambulatory Visit (HOSPITAL_COMMUNITY): Payer: No Typology Code available for payment source | Attending: Obstetrics and Gynecology

## 2019-10-24 ENCOUNTER — Ambulatory Visit: Payer: No Typology Code available for payment source | Admitting: *Deleted

## 2019-10-24 ENCOUNTER — Other Ambulatory Visit: Payer: Self-pay

## 2019-10-24 VITALS — BP 120/74 | HR 95

## 2019-10-24 DIAGNOSIS — Z362 Encounter for other antenatal screening follow-up: Secondary | ICD-10-CM

## 2019-10-24 DIAGNOSIS — O0990 Supervision of high risk pregnancy, unspecified, unspecified trimester: Secondary | ICD-10-CM | POA: Diagnosis present

## 2019-10-24 DIAGNOSIS — Z3480 Encounter for supervision of other normal pregnancy, unspecified trimester: Secondary | ICD-10-CM | POA: Diagnosis present

## 2019-10-24 DIAGNOSIS — O09292 Supervision of pregnancy with other poor reproductive or obstetric history, second trimester: Secondary | ICD-10-CM | POA: Diagnosis not present

## 2019-10-24 DIAGNOSIS — O3503X0 Maternal care for (suspected) central nervous system malformation or damage in fetus, choroid plexus cysts, not applicable or unspecified: Secondary | ICD-10-CM

## 2019-10-24 DIAGNOSIS — O358XX0 Maternal care for other (suspected) fetal abnormality and damage, not applicable or unspecified: Secondary | ICD-10-CM

## 2019-10-24 DIAGNOSIS — Z3A22 22 weeks gestation of pregnancy: Secondary | ICD-10-CM

## 2019-10-24 DIAGNOSIS — O350XX0 Maternal care for (suspected) central nervous system malformation in fetus, not applicable or unspecified: Secondary | ICD-10-CM | POA: Insufficient documentation

## 2019-11-03 ENCOUNTER — Encounter: Payer: Self-pay | Admitting: Obstetrics and Gynecology

## 2019-11-03 ENCOUNTER — Telehealth (INDEPENDENT_AMBULATORY_CARE_PROVIDER_SITE_OTHER): Payer: No Typology Code available for payment source | Admitting: Obstetrics and Gynecology

## 2019-11-03 VITALS — BP 109/71 | Wt 160.0 lb

## 2019-11-03 DIAGNOSIS — Z8759 Personal history of other complications of pregnancy, childbirth and the puerperium: Secondary | ICD-10-CM

## 2019-11-03 DIAGNOSIS — Z3A24 24 weeks gestation of pregnancy: Secondary | ICD-10-CM

## 2019-11-03 DIAGNOSIS — Z3482 Encounter for supervision of other normal pregnancy, second trimester: Secondary | ICD-10-CM

## 2019-11-03 DIAGNOSIS — Z3480 Encounter for supervision of other normal pregnancy, unspecified trimester: Secondary | ICD-10-CM

## 2019-11-03 NOTE — Progress Notes (Signed)
   MY CHART VIDEO VIRTUAL OBSTETRICS VISIT ENCOUNTER NOTE  I connected with Janice Matthews on 11/03/19 at  1:10 PM EDT by My Chart video at home and verified that I am speaking with the correct person using two identifiers. Provider located at [facility] for [employer] at [place].   I discussed the limitations, risks, security and privacy concerns of performing an evaluation and management service by My Chart video and the availability of in person appointments. I also discussed with the patient that there may be a patient responsible charge related to this service. The patient expressed understanding and agreed to proceed.  Subjective:  Janice Matthews is a 34 y.o. G3P2002 at [redacted]w[redacted]d being followed for ongoing prenatal care.  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  Patient reports occasional BH ctx's not painful. Reports fetal movement. Denies any contractions, bleeding or leaking of fluid.   The following portions of the patient's history were reviewed and updated as appropriate: allergies, current medications, past family history, past medical history, past social history, past surgical history and problem list.   Objective:   General:  Alert, oriented and cooperative.   Mental Status: Normal mood and affect perceived. Normal judgment and thought content.  Rest of physical exam deferred due to type of encounter  BP 109/71   Wt 160 lb (72.6 kg)   LMP 05/19/2019 (Exact Date)   BMI 26.63 kg/m  **Done by patient's own at home BP cuff and scale  Assessment and Plan:  Pregnancy: G3P2002 at [redacted]w[redacted]d  1. History of gestational hypertension - taking daily bASA - Checking BP weekly with no issues - No s/x of PEC  2. Supervision of other normal pregnancy, antepartum -  Anticipatory guidance for 2 hr GTT, 3rd trimester labs, tdap at 28 wks (scheduled for 12/01/19) - BH ctxs are normal variation during pregnancy - Call for >6 painful ctxs in 1 hour  Preterm labor symptoms and general obstetric precautions including but not limited to vaginal bleeding, contractions, leaking of fluid and fetal movement were reviewed in detail with the patient.  I discussed the assessment and treatment plan with the patient. The patient was provided an opportunity to ask questions and all were answered. The patient agreed with the plan and demonstrated an understanding of the instructions. The patient was advised to call back or seek an in-person office evaluation/go to MAU at [facility] for any urgent or concerning symptoms. Please refer to After Visit Summary for other counseling recommendations.   I provided 5 minutes of non-face-to-face time during this encounter. There was 5 minutes of chart review time spent prior to this encounter. Total time spent = 10 minutes.  Return in 4 weeks (on 12/01/2019) for Return OB 2hr GTT.  Future Appointments  Date Time Provider Department Center  12/01/2019  8:30 AM Raelyn Mora, CNM [facility] None    Raelyn Mora, CNM Center for [employer], [facility]

## 2019-11-03 NOTE — Patient Instructions (Signed)
Glucose Tolerance Test During Pregnancy Why am I having this test? The glucose tolerance test (GTT) is done to check how your body processes sugar (glucose). This is one of several tests used to diagnose diabetes that develops during pregnancy (gestational diabetes mellitus). Gestational diabetes is a temporary form of diabetes that some women develop during pregnancy. It usually occurs during the second trimester of pregnancy and goes away after delivery. Testing (screening) for gestational diabetes usually occurs between 24 and 28 weeks of pregnancy. You may have the GTT test after having a 1-hour glucose screening test if the results from that test indicate that you may have gestational diabetes. You may also have this test if:  You have a history of gestational diabetes.  You have a history of giving birth to very large babies or have experienced repeated fetal loss (stillbirth).  You have signs and symptoms of diabetes, such as: ? Changes in your vision. ? Tingling or numbness in your hands or feet. ? Changes in hunger, thirst, and urination that are not otherwise explained by your pregnancy. What is being tested? This test measures the amount of glucose in your blood at different times during a period of 3 hours. This indicates how well your body is able to process glucose. What kind of sample is taken?  Blood samples are required for this test. They are usually collected by inserting a needle into a blood vessel. How do I prepare for this test?  For 3 days before your test, eat normally. Have plenty of carbohydrate-rich foods.  Follow instructions from your health care provider about: ? Eating or drinking restrictions on the day of the test. You may be asked to not eat or drink anything other than water (fast) starting 8-10 hours before the test. ? Changing or stopping your regular medicines. Some medicines may interfere with this test. Tell a health care provider about:  All  medicines you are taking, including vitamins, herbs, eye drops, creams, and over-the-counter medicines.  Any blood disorders you have.  Any surgeries you have had.  Any medical conditions you have. What happens during the test? First, your blood glucose will be measured. This is referred to as your fasting blood glucose, since you fasted before the test. Then, you will drink a glucose solution that contains a certain amount of glucose. Your blood glucose will be measured again 1, 2, and 3 hours after drinking the solution. This test takes about 3 hours to complete. You will need to stay at the testing location during this time. During the testing period:  Do not eat or drink anything other than the glucose solution.  Do not exercise.  Do not use any products that contain nicotine or tobacco, such as cigarettes and e-cigarettes. If you need help stopping, ask your health care provider. The testing procedure may vary among health care providers and hospitals. How are the results reported? Your results will be reported as milligrams of glucose per deciliter of blood (mg/dL) or millimoles per liter (mmol/L). Your health care provider will compare your results to normal ranges that were established after testing a large group of people (reference ranges). Reference ranges may vary among labs and hospitals. For this test, common reference ranges are:  Fasting: less than 95-105 mg/dL (5.3-5.8 mmol/L).  1 hour after drinking glucose: less than 180-190 mg/dL (10.0-10.5 mmol/L).  2 hours after drinking glucose: less than 155-165 mg/dL (8.6-9.2 mmol/L).  3 hours after drinking glucose: 140-145 mg/dL (7.8-8.1 mmol/L). What do the   results mean? Results within reference ranges are considered normal, meaning that your glucose levels are well-controlled. If two or more of your blood glucose levels are high, you may be diagnosed with gestational diabetes. If only one level is high, your health care  provider may suggest repeat testing or other tests to confirm a diagnosis. Talk with your health care provider about what your results mean. Questions to ask your health care provider Ask your health care provider, or the department that is doing the test:  When will my results be ready?  How will I get my results?  What are my treatment options?  What other tests do I need?  What are my next steps? Summary  The glucose tolerance test (GTT) is one of several tests used to diagnose diabetes that develops during pregnancy (gestational diabetes mellitus). Gestational diabetes is a temporary form of diabetes that some women develop during pregnancy.  You may have the GTT test after having a 1-hour glucose screening test if the results from that test indicate that you may have gestational diabetes. You may also have this test if you have any symptoms or risk factors for gestational diabetes.  Talk with your health care provider about what your results mean. This information is not intended to replace advice given to you by your health care provider. Make sure you discuss any questions you have with your health care provider. Document Revised: 09/09/2018 Document Reviewed: 12/29/2016 Elsevier Patient Education  2020 Elsevier Inc.  

## 2019-12-01 ENCOUNTER — Ambulatory Visit (INDEPENDENT_AMBULATORY_CARE_PROVIDER_SITE_OTHER): Payer: No Typology Code available for payment source | Admitting: Obstetrics and Gynecology

## 2019-12-01 ENCOUNTER — Encounter: Payer: Self-pay | Admitting: Obstetrics and Gynecology

## 2019-12-01 ENCOUNTER — Other Ambulatory Visit: Payer: Self-pay

## 2019-12-01 VITALS — BP 109/73 | HR 88 | Temp 97.9°F | Wt 165.2 lb

## 2019-12-01 DIAGNOSIS — Z23 Encounter for immunization: Secondary | ICD-10-CM

## 2019-12-01 DIAGNOSIS — Z3480 Encounter for supervision of other normal pregnancy, unspecified trimester: Secondary | ICD-10-CM

## 2019-12-01 DIAGNOSIS — Z3483 Encounter for supervision of other normal pregnancy, third trimester: Secondary | ICD-10-CM

## 2019-12-01 DIAGNOSIS — Z3A28 28 weeks gestation of pregnancy: Secondary | ICD-10-CM

## 2019-12-01 NOTE — Progress Notes (Signed)
° °  LOW-RISK PREGNANCY OFFICE VISIT Patient name: Janice Matthews MRN [id number]  Date of birth: Oct 20, 1985 Chief Complaint:   Routine Prenatal Visit  History of Present Illness:   Janice Matthews is a 34 y.o. G38P2002 female at [redacted]w[redacted]d with an Estimated Date of Delivery: 02/23/20 being seen today for ongoing management of a low-risk pregnancy.  Today she reports no complaints. Contractions: Irregular. Vag. Bleeding: None.  Movement: Present. denies leaking of fluid. Review of Systems:   Pertinent items are noted in HPI Denies abnormal vaginal discharge w/ itching/odor/irritation, headaches, visual changes, shortness of breath, chest pain, abdominal pain, severe nausea/vomiting, or problems with urination or bowel movements unless otherwise stated above. Pertinent History Reviewed:  Reviewed past medical,surgical, social, obstetrical and family history.  Reviewed problem list, medications and allergies. Physical Assessment:   Vitals:   12/01/19 0834  BP: 109/73  Pulse: 88  Temp: 97.9 F (36.6 C)  Weight: 165 lb 3.2 oz (74.9 kg)  Body mass index is 27.49 kg/m.        Physical Examination:   General appearance: Well appearing, and in no distress  Mental status: Alert, oriented to person, place, and time  Skin: Warm & dry  Cardiovascular: Normal heart rate noted  Respiratory: Normal respiratory effort, no distress  Abdomen: Soft, gravid, nontender  Pelvic: Cervical exam deferred         Extremities: Edema: Trace  Fetal Status: Fetal Heart Rate (bpm): 144 Fundal Height: 27 cm Movement: Present    No results found for this or any previous visit (from the past 24 hour(s)).  Assessment & Plan:  1) Low-risk pregnancy G3P2002 at [redacted]w[redacted]d with an Estimated Date of Delivery: 02/23/20   2) Supervision of other normal pregnancy, antepartum  - Glucose Tolerance, 2 Hours w/1 Hour,  - HIV Antibody (routine testing w rflx),  - RPR,  - CBC,  - Tdap vaccine greater than or equal to 7yo IM -  Discussed My Chart video visit @ 32 wks, then GBS, cervical check and pap at 36 wks. Offered every 2 wk or weekly visits after 36 wks - pt will decide when the time comes   Meds: No orders of the defined types were placed in this encounter.  Labs/procedures today: 2 hr GTT, 3rd trimester labs and Tdap  Plan:  Continue routine obstetrical care   Reviewed: Preterm labor symptoms and general obstetric precautions including but not limited to vaginal bleeding, contractions, leaking of fluid and fetal movement were reviewed in detail with the patient.  All questions were answered. Has home bp cuff. Check bp weekly, let us know if >140/90.   Follow-up: Return in about 4 weeks (around 12/29/2019) for Return OB - My Chart video.  Orders Placed This Encounter  Procedures   Tdap vaccine greater than or equal to 7yo IM   Glucose Tolerance, 2 Hours w/1 Hour   HIV Antibody (routine testing w rflx)   RPR   CBC   Raelyn Mora MSN, CNM 12/01/2019

## 2019-12-01 NOTE — Patient Instructions (Signed)
Third Trimester of Pregnancy The third trimester is from week 28 through week 40 (months 7 through 9). The third trimester is a time when the unborn baby (fetus) is growing rapidly. At the end of the ninth month, the fetus is about 20 inches in length and weighs 6-10 pounds. Body changes during your third trimester Your body will continue to go through many changes during pregnancy. The changes vary from woman to woman. During the third trimester:  Your weight will continue to increase. You can expect to gain 25-35 pounds (11-16 kg) by the end of the pregnancy.  You may begin to get stretch marks on your hips, abdomen, and breasts.  You may urinate more often because the fetus is moving lower into your pelvis and pressing on your bladder.  You may develop or continue to have heartburn. This is caused by increased hormones that slow down muscles in the digestive tract.  You may develop or continue to have constipation because increased hormones slow digestion and cause the muscles that push waste through your intestines to relax.  You may develop hemorrhoids. These are swollen veins (varicose veins) in the rectum that can itch or be painful.  You may develop swollen, bulging veins (varicose veins) in your legs.  You may have increased body aches in the pelvis, back, or thighs. This is due to weight gain and increased hormones that are relaxing your joints.  You may have changes in your hair. These can include thickening of your hair, rapid growth, and changes in texture. Some women also have hair loss during or after pregnancy, or hair that feels dry or thin. Your hair will most likely return to normal after your baby is born.  Your breasts will continue to grow and they will continue to become tender. A yellow fluid (colostrum) may leak from your breasts. This is the first milk you are producing for your baby.  Your belly button may stick out.  You may notice more swelling in your hands,  face, or ankles.  You may have increased tingling or numbness in your hands, arms, and legs. The skin on your belly may also feel numb.  You may feel short of breath because of your expanding uterus.  You may have more problems sleeping. This can be caused by the size of your belly, increased need to urinate, and an increase in your body's metabolism.  You may notice the fetus "dropping," or moving lower in your abdomen (lightening).  You may have increased vaginal discharge.  You may notice your joints feel loose and you may have pain around your pelvic bone. What to expect at prenatal visits You will have prenatal exams every 2 weeks until week 36. Then you will have weekly prenatal exams. During a routine prenatal visit:  You will be weighed to make sure you and the baby are growing normally.  Your blood pressure will be taken.  Your abdomen will be measured to track your baby's growth.  The fetal heartbeat will be listened to.  Any test results from the previous visit will be discussed.  You may have a cervical check near your due date to see if your cervix has softened or thinned (effaced).  You will be tested for Group B streptococcus. This happens between 35 and 37 weeks. Your health care provider may ask you:  What your birth plan is.  How you are feeling.  If you are feeling the baby move.  If you have had any abnormal   symptoms, such as leaking fluid, bleeding, severe headaches, or abdominal cramping.  If you are using any tobacco products, including cigarettes, chewing tobacco, and electronic cigarettes.  If you have any questions. Other tests or screenings that may be performed during your third trimester include:  Blood tests that check for low iron levels (anemia).  Fetal testing to check the health, activity level, and growth of the fetus. Testing is done if you have certain medical conditions or if there are problems during the pregnancy.  Nonstress test  (NST). This test checks the health of your baby to make sure there are no signs of problems, such as the baby not getting enough oxygen. During this test, a belt is placed around your belly. The baby is made to move, and its heart rate is monitored during movement. What is false labor? False labor is a condition in which you feel small, irregular tightenings of the muscles in the womb (contractions) that usually go away with rest, changing position, or drinking water. These are called Braxton Hicks contractions. Contractions may last for hours, days, or even weeks before true labor sets in. If contractions come at regular intervals, become more frequent, increase in intensity, or become painful, you should see your health care provider. What are the signs of labor?  Abdominal cramps.  Regular contractions that start at 10 minutes apart and become stronger and more frequent with time.  Contractions that start on the top of the uterus and spread down to the lower abdomen and back.  Increased pelvic pressure and dull back pain.  A watery or bloody mucus discharge that comes from the vagina.  Leaking of amniotic fluid. This is also known as your "water breaking." It could be a slow trickle or a gush. Let your health care provider know if it has a color or strange odor. If you have any of these signs, call your health care provider right away, even if it is before your due date. Follow these instructions at home: Medicines  Follow your health care provider's instructions regarding medicine use. Specific medicines may be either safe or unsafe to take during pregnancy.  Take a prenatal vitamin that contains at least 600 micrograms (mcg) of folic acid.  If you develop constipation, try taking a stool softener if your health care provider approves. Eating and drinking   Eat a balanced diet that includes fresh fruits and vegetables, whole grains, good sources of protein such as meat, eggs, or tofu,  and low-fat dairy. Your health care provider will help you determine the amount of weight gain that is right for you.  Avoid raw meat and uncooked cheese. These carry germs that can cause birth defects in the baby.  If you have low calcium intake from food, talk to your health care provider about whether you should take a daily calcium supplement.  Eat four or five small meals rather than three large meals a day.  Limit foods that are high in fat and processed sugars, such as fried and sweet foods.  To prevent constipation: ? Drink enough fluid to keep your urine clear or pale yellow. ? Eat foods that are high in fiber, such as fresh fruits and vegetables, whole grains, and beans. Activity  Exercise only as directed by your health care provider. Most women can continue their usual exercise routine during pregnancy. Try to exercise for 30 minutes at least 5 days a week. Stop exercising if you experience uterine contractions.  Avoid heavy lifting.  Do   not exercise in extreme heat or humidity, or at high altitudes.  Wear low-heel, comfortable shoes.  Practice good posture.  You may continue to have sex unless your health care provider tells you otherwise. Relieving pain and discomfort  Take frequent breaks and rest with your legs elevated if you have leg cramps or low back pain.  Take warm sitz baths to soothe any pain or discomfort caused by hemorrhoids. Use hemorrhoid cream if your health care provider approves.  Wear a good support bra to prevent discomfort from breast tenderness.  If you develop varicose veins: ? Wear support pantyhose or compression stockings as told by your healthcare provider. ? Elevate your feet for 15 minutes, 3-4 times a day. Prenatal care  Write down your questions. Take them to your prenatal visits.  Keep all your prenatal visits as told by your health care provider. This is important. Safety  Wear your seat belt at all times when driving.  Make  a list of emergency phone numbers, including numbers for family, friends, the hospital, and police and fire departments. General instructions  Avoid cat litter boxes and soil used by cats. These carry germs that can cause birth defects in the baby. If you have a cat, ask someone to clean the litter box for you.  Do not travel far distances unless it is absolutely necessary and only with the approval of your health care provider.  Do not use hot tubs, steam rooms, or saunas.  Do not drink alcohol.  Do not use any products that contain nicotine or tobacco, such as cigarettes and e-cigarettes. If you need help quitting, ask your health care provider.  Do not use any medicinal herbs or unprescribed drugs. These chemicals affect the formation and growth of the baby.  Do not douche or use tampons or scented sanitary pads.  Do not cross your legs for long periods of time.  To prepare for the arrival of your baby: ? Take prenatal classes to understand, practice, and ask questions about labor and delivery. ? Make a trial run to the hospital. ? Visit the hospital and tour the maternity area. ? Arrange for maternity or paternity leave through employers. ? Arrange for family and friends to take care of pets while you are in the hospital. ? Purchase a rear-facing car seat and make sure you know how to install it in your car. ? Pack your hospital bag. ? Prepare the baby's nursery. Make sure to remove all pillows and stuffed animals from the baby's crib to prevent suffocation.  Visit your dentist if you have not gone during your pregnancy. Use a soft toothbrush to brush your teeth and be gentle when you floss. Contact a health care provider if:  You are unsure if you are in labor or if your water has broken.  You become dizzy.  You have mild pelvic cramps, pelvic pressure, or nagging pain in your abdominal area.  You have lower back pain.  You have persistent nausea, vomiting, or  diarrhea.  You have an unusual or bad smelling vaginal discharge.  You have pain when you urinate. Get help right away if:  Your water breaks before 37 weeks.  You have regular contractions less than 5 minutes apart before 37 weeks.  You have a fever.  You are leaking fluid from your vagina.  You have spotting or bleeding from your vagina.  You have severe abdominal pain or cramping.  You have rapid weight loss or weight gain.  You have   shortness of breath with chest pain.  You notice sudden or extreme swelling of your face, hands, ankles, feet, or legs.  Your baby makes fewer than 10 movements in 2 hours.  You have severe headaches that do not go away when you take medicine.  You have vision changes. Summary  The third trimester is from week 28 through week 40, months 7 through 9. The third trimester is a time when the unborn baby (fetus) is growing rapidly.  During the third trimester, your discomfort may increase as you and your baby continue to gain weight. You may have abdominal, leg, and back pain, sleeping problems, and an increased need to urinate.  During the third trimester your breasts will keep growing and they will continue to become tender. A yellow fluid (colostrum) may leak from your breasts. This is the first milk you are producing for your baby.  False labor is a condition in which you feel small, irregular tightenings of the muscles in the womb (contractions) that eventually go away. These are called Braxton Hicks contractions. Contractions may last for hours, days, or even weeks before true labor sets in.  Signs of labor can include: abdominal cramps; regular contractions that start at 10 minutes apart and become stronger and more frequent with time; watery or bloody mucus discharge that comes from the vagina; increased pelvic pressure and dull back pain; and leaking of amniotic fluid. This information is not intended to replace advice given to you by your  health care provider. Make sure you discuss any questions you have with your health care provider. Document Revised: 09/09/2018 Document Reviewed: 06/24/2016 Elsevier Patient Education  2020 [employer] Iron-Rich Diet  Iron is a mineral that helps your body to produce hemoglobin. Hemoglobin is a protein in red blood cells that carries oxygen to your body's tissues. Eating too little iron may cause you to feel weak and tired, and it can increase your risk of infection. Iron is naturally found in many foods, and many foods have iron added to them (iron-fortified foods). You may need to follow an iron-rich diet if you do not have enough iron in your body due to certain medical conditions. The amount of iron that you need each day depends on your age, your sex, and any medical conditions you have. Follow instructions from your health care provider or a diet and nutrition specialist (dietitian) about how much iron you should eat each day. What are tips for following this plan? Reading food labels  Check food labels to see how many milligrams (mg) of iron are in each serving. Cooking  Cook foods in pots and pans that are made from iron.  Take these steps to make it easier for your body to absorb iron from certain foods: ? Soak beans overnight before cooking. ? Soak whole grains overnight and drain them before using. ? Ferment flours before baking, such as by using yeast in bread dough. Meal planning  When you eat foods that contain iron, you should eat them with foods that are high in vitamin C. These include oranges, peppers, tomatoes, potatoes, and mango. Vitamin C helps your body to absorb iron. General information  Take iron supplements only as told by your health care provider. An overdose of iron can be life-threatening. If you were prescribed iron supplements, take them with orange juice or a vitamin C supplement.  When you eat iron-fortified foods or take an iron supplement, you should  also eat foods that naturally contain iron, such  as meat, poultry, and fish. Eating naturally iron-rich foods helps your body to absorb the iron that is added to other foods or contained in a supplement.  Certain foods and drinks prevent your body from absorbing iron properly. Avoid eating these foods in the same meal as iron-rich foods or with iron supplements. These foods include: ? Coffee, black tea, and red wine. ? Milk, dairy products, and foods that are high in calcium. ? Beans and soybeans. ? Whole grains. What foods should I eat? Fruits Prunes. Raisins. Eat fruits high in vitamin C, such as oranges, grapefruits, and strawberries, alongside iron-rich foods. Vegetables Spinach (cooked). Green peas. Broccoli. Fermented vegetables. Eat vegetables high in vitamin C, such as leafy greens, potatoes, bell peppers, and tomatoes, alongside iron-rich foods. Grains Iron-fortified breakfast cereal. Iron-fortified whole-wheat bread. Enriched rice. Sprouted grains. Meats and other proteins Beef liver. Oysters. Beef. Shrimp. [place]. Chicken. Tuna. Sardines. Chickpeas. Nuts. Tofu. Pumpkin seeds. Beverages Tomato juice. Fresh orange juice. Prune juice. Hibiscus tea. Fortified instant breakfast shakes. Sweets and desserts Blackstrap molasses. Seasonings and condiments Tahini. Fermented soy sauce. Other foods Wheat germ. The items listed above may not be a complete list of recommended foods and beverages. Contact a dietitian for more information. What foods should I avoid? Grains Whole grains. Bran cereal. Bran flour. Oats. Meats and other proteins Soybeans. Products made from soy protein. Black beans. Lentils. Mung beans. Split peas. Dairy Milk. Cream. Cheese. Yogurt. Cottage cheese. Beverages Coffee. Black tea. Red wine. Sweets and desserts Cocoa. Chocolate. Ice cream. Other foods Basil. Oregano. Large amounts of parsley. The items listed above may not be a complete list of foods and  beverages to avoid. Contact a dietitian for more information. Summary  Iron is a mineral that helps your body to produce hemoglobin. Hemoglobin is a protein in red blood cells that carries oxygen to your body's tissues.  Iron is naturally found in many foods, and many foods have iron added to them (iron-fortified foods).  When you eat foods that contain iron, you should eat them with foods that are high in vitamin C. Vitamin C helps your body to absorb iron.  Certain foods and drinks prevent your body from absorbing iron properly, such as whole grains and dairy products. You should avoid eating these foods in the same meal as iron-rich foods or with iron supplements. This information is not intended to replace advice given to you by your health care provider. Make sure you discuss any questions you have with your health care provider. Document Revised: 05/01/2017 Document Reviewed: 04/14/2017 Elsevier Patient Education  2020 Else[employer] Inc. Fetal Movement Counts Patient Name: ________________________________________________ Patient Due Date: ____________________ What is a fetal movement count?  A fetal movement count is the number of times that you feel your baby move during a certain amount of time. This may also be called a fetal kick count. A fetal movement count is recommended for every pregnant woman. You may be asked to start counting fetal movements as early as week 28 of your pregnancy. Pay attention to when your baby is most active. You may notice your baby's sleep and wake cycles. You may also notice things that make your baby move more. You should do a fetal movement count:  When your baby is normally most active.  At the same time each day. A good time to count movements is while you are resting, after having something to eat and drink. How do I count fetal movements? 1. Find a quiet, comfortable area. Sit,  or lie down on your side. 2. Write down the date, the start time and stop  time, and the number of movements that you felt between those two times. Take this information with you to your health care visits. 3. Write down your start time when you feel the first movement. 4. Count kicks, flutters, swishes, rolls, and jabs. You should feel at least 10 movements. 5. You may stop counting after you have felt 10 movements, or if you have been counting for 2 hours. Write down the stop time. 6. If you do not feel 10 movements in 2 hours, contact your health care provider for further instructions. Your health care provider may want to do additional tests to assess your baby's well-being. Contact a health care provider if:  You feel fewer than 10 movements in 2 hours.  Your baby is not moving like he or she usually does. Date: ____________ Start time: ____________ Stop time: ____________ Movements: ____________ Date: ____________ Start time: ____________ Stop time: ____________ Movements: ____________ Date: ____________ Start time: ____________ Stop time: ____________ Movements: ____________ Date: ____________ Start time: ____________ Stop time: ____________ Movements: ____________ Date: ____________ Start time: ____________ Stop time: ____________ Movements: ____________ Date: ____________ Start time: ____________ Stop time: ____________ Movements: ____________ Date: ____________ Start time: ____________ Stop time: ____________ Movements: ____________ Date: ____________ Start time: ____________ Stop time: ____________ Movements: ____________ Date: ____________ Start time: ____________ Stop time: ____________ Movements: ____________ This information is not intended to replace advice given to you by your health care provider. Make sure you discuss any questions you have with your health care provider. Document Revised: 01/06/2019 Document Reviewed: 01/06/2019 Elsevier Patient Education  2020 [employer].

## 2019-12-02 LAB — GLUCOSE TOLERANCE, 2 HOURS W/ 1HR
Glucose, 1 hour: 145 mg/dL (ref 65–179)
Glucose, 2 hour: 115 mg/dL (ref 65–152)
Glucose, Fasting: 78 mg/dL (ref 65–91)

## 2019-12-02 LAB — RPR: RPR Ser Ql: NONREACTIVE

## 2019-12-02 LAB — HIV ANTIBODY (ROUTINE TESTING W REFLEX): HIV Screen 4th Generation wRfx: NONREACTIVE

## 2019-12-02 LAB — CBC
Hematocrit: 38.1 % (ref 34.0–46.6)
Hemoglobin: 13 g/dL (ref 11.1–15.9)
MCH: 31.3 pg (ref 26.6–33.0)
MCHC: 34.1 g/dL (ref 31.5–35.7)
MCV: 92 fL (ref 79–97)
Platelets: 300 10*3/uL (ref 150–450)
RBC: 4.15 x10E6/uL (ref 3.77–5.28)
RDW: 12.1 % (ref 11.7–15.4)
WBC: 10.5 10*3/uL (ref 3.4–10.8)

## 2019-12-29 ENCOUNTER — Telehealth (INDEPENDENT_AMBULATORY_CARE_PROVIDER_SITE_OTHER): Payer: No Typology Code available for payment source | Admitting: Obstetrics and Gynecology

## 2019-12-29 VITALS — BP 107/77 | HR 92 | Wt 167.0 lb

## 2019-12-29 DIAGNOSIS — Z3480 Encounter for supervision of other normal pregnancy, unspecified trimester: Secondary | ICD-10-CM | POA: Diagnosis not present

## 2020-01-01 ENCOUNTER — Encounter: Payer: Self-pay | Admitting: Obstetrics and Gynecology

## 2020-01-01 NOTE — Progress Notes (Signed)
° °  MY CHART VIDEO VIRTUAL OBSTETRICS VISIT ENCOUNTER NOTE  I connected with [name] on 01/01/20 at 11:30 AM EDT by My Chart video at home and verified that I am speaking with the correct person using two identifiers. Spouse Bettey Mare is also present for entire visit. Provider located at [facility] for [employer] at [place].   I discussed the limitations, risks, security and privacy concerns of performing an evaluation and management service by My Chart video and the availability of in person appointments. I also discussed with the patient that there may be a patient responsible charge related to this service. The patient expressed understanding and agreed to proceed.  Subjective:  Janice Matthews is a 34 y.o. G3P2002 at [redacted]w[redacted]d being followed for ongoing prenatal care.  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  Patient reports no complaints and patient and spouse desired an elective induction at 40 wks d/t childcare issues. FH recorded by patient's spouse: 32 cm. Reports fetal movement. Denies any contractions, bleeding or leaking of fluid.   The following portions of the patient's history were reviewed and updated as appropriate: allergies, current medications, past family history, past medical history, past social history, past surgical history and problem list.   Objective:   General:  Alert, oriented and cooperative.   Mental Status: Normal mood and affect perceived. Normal judgment and thought content.  Rest of physical exam deferred due to type of encounter  BP 107/77    Pulse 92    Wt 167 lb (75.8 kg)    LMP 05/19/2019 (Exact Date)    BMI 27.79 kg/m  **Done by patient's own at home BP cuff and scale  Assessment and Plan:  Pregnancy: G3P2002 at [redacted]w[redacted]d  Supervision of  other normal pregnancy, antepartum - Advised that elective IOL is reasonable with her multigravida status - Discussed GBS testing and cervical check at [place]  Preterm labor symptoms and general obstetric precautions including but not limited to vaginal bleeding, contractions, leaking of fluid and fetal movement were reviewed in detail with the patient.  I discussed the assessment and treatment plan with the patient. The patient was provided an opportunity to ask questions and all were answered. The patient agreed with the plan and demonstrated an understanding of the instructions. The patient was advised to call back or seek an in-person office evaluation/go to [facility] at [facility] for any urgent or concerning symptoms. Please refer to After Visit Summary for other counseling recommendations.   I provided 10 minutes of non-face-to-face time during this encounter. There was 5 minutes of chart review time spent prior to this encounter. Total time spent = 15 minutes.  Return in about 2 weeks (around 01/12/2020) for Return OB w/GBS.  Future Appointments  Date Time Provider Department Center  01/12/2020 10:50 AM Raelyn Mora, CNM [facility] None    Raelyn Mora, CNM Center for [employer], [facility]

## 2020-01-12 ENCOUNTER — Telehealth: Payer: No Typology Code available for payment source | Admitting: Obstetrics and Gynecology

## 2020-01-13 ENCOUNTER — Encounter: Payer: Self-pay | Admitting: Certified Nurse Midwife

## 2020-01-13 ENCOUNTER — Telehealth (INDEPENDENT_AMBULATORY_CARE_PROVIDER_SITE_OTHER): Payer: No Typology Code available for payment source | Admitting: Certified Nurse Midwife

## 2020-01-13 VITALS — BP 109/77 | Wt 170.0 lb

## 2020-01-13 DIAGNOSIS — O350XX0 Maternal care for (suspected) central nervous system malformation in fetus, not applicable or unspecified: Secondary | ICD-10-CM

## 2020-01-13 DIAGNOSIS — Z3A34 34 weeks gestation of pregnancy: Secondary | ICD-10-CM

## 2020-01-13 DIAGNOSIS — Z3480 Encounter for supervision of other normal pregnancy, unspecified trimester: Secondary | ICD-10-CM

## 2020-01-13 DIAGNOSIS — Z8759 Personal history of other complications of pregnancy, childbirth and the puerperium: Secondary | ICD-10-CM

## 2020-01-13 NOTE — Progress Notes (Signed)
109/77 170 WT

## 2020-01-13 NOTE — Progress Notes (Signed)
   OBSTETRICS PRENATAL VIRTUAL VISIT ENCOUNTER NOTE  Provider location: Center for [facility]   I connected with Janice Matthews on 01/13/20 at 11:10 AM EDT by MyChart Video Encounter at home and verified that I am speaking with the correct person using two identifiers.   I discussed the limitations, risks, security and privacy concerns of performing an evaluation and management service virtually and the availability of in person appointments. I also discussed with the patient that there may be a patient responsible charge related to this service. The patient expressed understanding and agreed to proceed. Subjective:  Janice Matthews is a 34 y.o. G3P2002 at [redacted]w[redacted]d being seen today for ongoing prenatal care.  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  Patient reports no complaints.  Contractions: Irregular.  .  Movement: Present. Denies any leaking of fluid.   The following portions of the patient's history were reviewed and updated as appropriate: allergies, current medications, past family history, past medical history, past social history, past surgical history and problem list.   Objective:   Vitals:   01/13/20 1111  BP: 109/77  Weight: 170 lb (77.1 kg)    Fetal Status:     Movement: Present     General:  Alert, oriented and cooperative. Patient is in no acute distress.  Respiratory: Normal respiratory effort, no problems with respiration noted  Mental Status: Normal mood and affect. Normal behavior. Normal judgment and thought content.  Rest of physical exam deferred due to type of encounter  Assessment and Plan:  Pregnancy: G3P2002 at [redacted]w[redacted]d 1. Supervision of other normal pregnancy, antepartum - Pt requests flu shot at next visit  2. 34 weeks of  gestation - Anticipatory guidance given about next visit re in-person GBS testing  Preterm labor symptoms and general obstetric precautions including but not limited to vaginal bleeding, contractions, leaking of fluid and fetal movement were reviewed in detail with the patient. I discussed the assessment and treatment plan with the patient. The patient was provided an opportunity to ask questions and all were answered. The patient agreed with the plan and demonstrated an understanding of the instructions. The patient was advised to call back or seek an in-person office evaluation/go to MAU at [facility] for any urgent or concerning symptoms. Please refer to After Visit Summary for other counseling recommendations.   I provided 10 minutes of face-to-face time during this encounter.  Future Appointments  Date Time Provider Department Center  01/26/2020  2:30 PM Raelyn Mora, CNM [facility] None   Edd Arbour, CNM, MSN, [facility] 01/13/20 11:43 AM

## 2020-01-26 ENCOUNTER — Encounter: Payer: Self-pay | Admitting: Obstetrics and Gynecology

## 2020-01-26 ENCOUNTER — Ambulatory Visit (INDEPENDENT_AMBULATORY_CARE_PROVIDER_SITE_OTHER): Payer: No Typology Code available for payment source | Admitting: Obstetrics and Gynecology

## 2020-01-26 ENCOUNTER — Other Ambulatory Visit (HOSPITAL_COMMUNITY)
Admission: RE | Admit: 2020-01-26 | Discharge: 2020-01-26 | Disposition: A | Discharge status: Home / Self Care | Payer: No Typology Code available for payment source | Source: Ambulatory Visit | Attending: Obstetrics and Gynecology | Admitting: Obstetrics and Gynecology

## 2020-01-26 ENCOUNTER — Other Ambulatory Visit: Payer: Self-pay

## 2020-01-26 VITALS — BP 117/87 | HR 97 | Temp 98.2°F | Wt 174.8 lb

## 2020-01-26 DIAGNOSIS — Z3480 Encounter for supervision of other normal pregnancy, unspecified trimester: Secondary | ICD-10-CM | POA: Insufficient documentation

## 2020-01-26 DIAGNOSIS — Z8759 Personal history of other complications of pregnancy, childbirth and the puerperium: Secondary | ICD-10-CM

## 2020-01-26 DIAGNOSIS — R030 Elevated blood-pressure reading, without diagnosis of hypertension: Secondary | ICD-10-CM | POA: Insufficient documentation

## 2020-01-26 NOTE — Progress Notes (Signed)
   LOW-RISK PREGNANCY OFFICE VISIT Patient name: Janice Matthews MRN [id number]  Date of birth: 1985-09-24 Chief Complaint:   Routine Prenatal Visit  History of Present Illness:   Janice Matthews is a 34 y.o. G22P2002 female at [redacted]w[redacted]d with an Estimated Date of Delivery: 02/23/20 being seen today for ongoing management of a low-risk pregnancy.  Today she reports no complaints. She expresses a desire to have NO unvaccinated staff involved in direct care during her hospitalization. Contractions: Not present. Vag. Bleeding: None.  Movement: Present. denies leaking of fluid. Review of Systems:   Pertinent items are noted in HPI Denies abnormal vaginal discharge w/ itching/odor/irritation, headaches, visual changes, shortness of breath, chest pain, abdominal pain, severe nausea/vomiting, or problems with urination or bowel movements unless otherwise stated above. Pertinent History Reviewed:  Reviewed past medical,surgical, social, obstetrical and family history.  Reviewed problem list, medications and allergies. Physical Assessment:   Vitals:   01/26/20 1441 01/26/20 1501  BP: (!) 134/99 117/87  Pulse: (!) 110 97  Temp: 98.2 F (36.8 C)   Weight: 174 lb 12.8 oz (79.3 kg)   Body mass index is 29.09 kg/m.        Physical Examination:   General appearance: Well appearing, and in no distress  Mental status: Alert, oriented to person, place, and time  Skin: Warm & dry  Cardiovascular: Normal heart rate noted  Respiratory: Normal respiratory effort, no distress  Abdomen: Soft, gravid, nontender  Pelvic: Cervical exam deferred         Extremities: Edema: Trace  Fetal Status: Fetal Heart Rate (bpm): 132 Fundal Height: 37 cm Movement: Present Presentation: Vertex  No results found for this or any previous visit (from the past 24 hour(s)).  Assessment & Plan:  1) Low-risk pregnancy G3P2002 at [redacted]w[redacted]d with an Estimated Date of Delivery: 02/23/20   2) Supervision of other normal pregnancy, antepartum   - Cervicovaginal ancillary only( [place]),  - Culture, beta strep (group b only),   History of pregnancy induced hypertension  - Protein / creatinine ratio, urine,  - Comprehensive metabolic panel,  - CBC   Meds: No orders of the defined types were placed in this encounter.  Labs/procedures today: GBS & Wet Prep  Plan:  Continue routine obstetrical care   Reviewed: Preterm labor symptoms and general obstetric precautions including but not limited to vaginal bleeding, contractions, leaking of fluid and fetal movement were reviewed in detail with the patient.  All questions were answered. Has home bp cuff. Check bp weekly, let us know if >140/90.   Follow-up: Return in about 2 weeks (around 02/09/2020) for Return OB visit.  Orders Placed This Encounter  Procedures  . Culture, beta strep (group b only)  . Protein / creatinine ratio, urine  . Comprehensive metabolic panel  . CBC   Raelyn Mora MSN, [place] 01/26/2020

## 2020-01-27 LAB — CERVICOVAGINAL ANCILLARY ONLY
Bacterial Vaginitis (gardnerella): NEGATIVE
Candida Glabrata: NEGATIVE
Candida Vaginitis: NEGATIVE
Chlamydia: NEGATIVE
Comment: NEGATIVE
Comment: NEGATIVE
Comment: NEGATIVE
Comment: NEGATIVE
Comment: NEGATIVE
Comment: NORMAL
Neisseria Gonorrhea: NEGATIVE
Trichomonas: NEGATIVE

## 2020-01-27 LAB — CBC
Hematocrit: 39.3 % (ref 34.0–46.6)
Hemoglobin: 13.4 g/dL (ref 11.1–15.9)
MCH: 31 pg (ref 26.6–33.0)
MCHC: 34.1 g/dL (ref 31.5–35.7)
MCV: 91 fL (ref 79–97)
Platelets: 282 10*3/uL (ref 150–450)
RBC: 4.32 x10E6/uL (ref 3.77–5.28)
RDW: 12.4 % (ref 11.7–15.4)
WBC: 11.6 10*3/uL — ABNORMAL HIGH (ref 3.4–10.8)

## 2020-01-27 LAB — COMPREHENSIVE METABOLIC PANEL
ALT: 21 IU/L (ref 0–32)
AST: 33 IU/L (ref 0–40)
Albumin/Globulin Ratio: 1.6 (ref 1.2–2.2)
Albumin: 3.5 g/dL — ABNORMAL LOW (ref 3.8–4.8)
Alkaline Phosphatase: 179 IU/L — ABNORMAL HIGH (ref 48–121)
BUN/Creatinine Ratio: 12 (ref 9–23)
BUN: 7 mg/dL (ref 6–20)
Bilirubin Total: 0.2 mg/dL (ref 0.0–1.2)
CO2: 21 mmol/L (ref 20–29)
Calcium: 9 mg/dL (ref 8.7–10.2)
Chloride: 103 mmol/L (ref 96–106)
Creatinine, Ser: 0.57 mg/dL (ref 0.57–1.00)
GFR calc Af Amer: 140 mL/min/{1.73_m2} (ref >59)
GFR calc non Af Amer: 121 mL/min/{1.73_m2} (ref >59)
Globulin, Total: 2.2 g/dL (ref 1.5–4.5)
Glucose: 90 mg/dL (ref 65–99)
Potassium: 3.9 mmol/L (ref 3.5–5.2)
Sodium: 137 mmol/L (ref 134–144)
Total Protein: 5.7 g/dL — ABNORMAL LOW (ref 6.0–8.5)

## 2020-01-27 LAB — PROTEIN / CREATININE RATIO, URINE
Creatinine, Urine: 29.8 mg/dL
Protein, Ur: <4 mg/dL

## 2020-01-30 LAB — CULTURE, BETA STREP (GROUP B ONLY): Strep Gp B Culture: NEGATIVE

## 2020-02-03 ENCOUNTER — Encounter: Payer: Self-pay | Admitting: General Practice

## 2020-02-08 ENCOUNTER — Encounter: Payer: No Typology Code available for payment source | Admitting: Student

## 2020-02-10 ENCOUNTER — Ambulatory Visit (INDEPENDENT_AMBULATORY_CARE_PROVIDER_SITE_OTHER): Payer: No Typology Code available for payment source

## 2020-02-10 ENCOUNTER — Other Ambulatory Visit: Payer: Self-pay

## 2020-02-10 VITALS — BP 122/93 | HR 100 | Temp 97.8°F | Wt 176.0 lb

## 2020-02-10 DIAGNOSIS — Z3A38 38 weeks gestation of pregnancy: Secondary | ICD-10-CM

## 2020-02-10 DIAGNOSIS — Z23 Encounter for immunization: Secondary | ICD-10-CM | POA: Diagnosis not present

## 2020-02-10 DIAGNOSIS — Z3480 Encounter for supervision of other normal pregnancy, unspecified trimester: Secondary | ICD-10-CM

## 2020-02-10 DIAGNOSIS — Z8759 Personal history of other complications of pregnancy, childbirth and the puerperium: Secondary | ICD-10-CM

## 2020-02-10 NOTE — Progress Notes (Signed)
   LOW-RISK PREGNANCY OFFICE VISIT  Patient name: Janice Matthews MRN [id number]  Date of birth: January 06, 1986 Chief Complaint:   Routine Prenatal Visit  Subjective:   Janice Matthews is a 34 y.o. G57P2002 female at [redacted]w[redacted]d with an Estimated Date of Delivery: 02/23/20 being seen today for ongoing management of a low-risk pregnancy aeb has Supervision of other normal pregnancy, antepartum; History of pregnancy induced hypertension; Choroid plexus cyst of fetus on prenatal ultrasound; and Elevated BP without diagnosis of hypertension on their problem list.  Patient presents today with no complaints. Patient endorses fetal movement and denies vaginal concerns including abnormal discharge, leaking of fluid, and bleeding.  Contractions: Irregular. Vag. Bleeding: None.  Movement: Present.  Reviewed past medical,surgical, social, obstetrical and family history as well as problem list, medications and allergies.  Objective   Vitals:   02/10/20 1118 02/10/20 1132  BP: (!) 134/94 (!) 122/93  Pulse: 90 100  Temp: 97.8 F (36.6 C)   Weight: 176 lb (79.8 kg)   Body mass index is 29.29 kg/m.  Total Weight Gain:26 lb (11.8 kg)         Physical Examination:   General appearance: Well appearing, and in no distress  Mental status: Alert, oriented to person, place, and time  Skin: Warm & dry  Cardiovascular: Normal heart rate noted  Respiratory: Normal respiratory effort, no distress  Abdomen: Soft, gravid, nontender, AGA with Fundal height of Fundal Height: 39 cm  Pelvic: Cervical exam deferred           Extremities: Edema: None  Fetal Status: Fetal Heart Rate (bpm): 130  Movement: Present   No results found for this or any previous visit (from the past 24 hour(s)).  Assessment & Plan:  Low-risk pregnancy of a 34 y.o., G3P2002 at [redacted]w[redacted]d with an Estimated Date of Delivery: 02/23/20   1. Supervision of other normal pregnancy, antepartum -Anticipatory guidance for upcoming appts -Discussed desires  regarding IOL after 40 weeks. -Informed that IOL can be scheduled as late as 41.4 weeks if no other complications arise. -RTO in one week  -Patient requests cervical exam at next visit. -Reviewed labor onset and when to report to hospital.   2. History of gestational hypertension -Elevated BP x 2 -Patient appears anxious. -Reports BP at home normal from 108-110s/70s -Will complete PreEclampsia labs. -Informed that if abnormal provider will contact for IOL tonight.   3. Need for immunization against influenza -Influenza given today.   Meds: No orders of the defined types were placed in this encounter.  Labs/procedures today:  Lab Orders     Protein / creatinine ratio, urine     CBC     Comprehensive metabolic panel   Reviewed: Term labor symptoms and general obstetric precautions including but not limited to vaginal bleeding, contractions, leaking of fluid and fetal movement were reviewed in detail with the patient.  All questions were answered.  Follow-up: No follow-ups on file.  Orders Placed This Encounter  Procedures  . Flu Vaccine QUAD 36+ mos IM  . Protein / creatinine ratio, urine  . CBC  . Comprehensive metabolic panel   Cherre Robins MSN, CNM 02/10/2020

## 2020-02-11 ENCOUNTER — Telehealth: Payer: Self-pay

## 2020-02-11 LAB — COMPREHENSIVE METABOLIC PANEL
ALT: 17 IU/L (ref 0–32)
AST: 24 IU/L (ref 0–40)
Albumin/Globulin Ratio: 1.8 (ref 1.2–2.2)
Albumin: 3.5 g/dL — ABNORMAL LOW (ref 3.8–4.8)
Alkaline Phosphatase: 210 IU/L — ABNORMAL HIGH (ref 48–121)
BUN/Creatinine Ratio: 11 (ref 9–23)
BUN: 8 mg/dL (ref 6–20)
Bilirubin Total: 0.3 mg/dL (ref 0.0–1.2)
CO2: 21 mmol/L (ref 20–29)
Calcium: 9.2 mg/dL (ref 8.7–10.2)
Chloride: 101 mmol/L (ref 96–106)
Creatinine, Ser: 0.73 mg/dL (ref 0.57–1.00)
GFR calc Af Amer: 124 mL/min/{1.73_m2} (ref >59)
GFR calc non Af Amer: 108 mL/min/{1.73_m2} (ref >59)
Globulin, Total: 2 g/dL (ref 1.5–4.5)
Glucose: 89 mg/dL (ref 65–99)
Potassium: 4.3 mmol/L (ref 3.5–5.2)
Sodium: 135 mmol/L (ref 134–144)
Total Protein: 5.5 g/dL — ABNORMAL LOW (ref 6.0–8.5)

## 2020-02-11 LAB — CBC
Hematocrit: 40.1 % (ref 34.0–46.6)
Hemoglobin: 13.7 g/dL (ref 11.1–15.9)
MCH: 31.1 pg (ref 26.6–33.0)
MCHC: 34.2 g/dL (ref 31.5–35.7)
MCV: 91 fL (ref 79–97)
Platelets: 288 10*3/uL (ref 150–450)
RBC: 4.4 x10E6/uL (ref 3.77–5.28)
RDW: 12.3 % (ref 11.7–15.4)
WBC: 12.8 10*3/uL — ABNORMAL HIGH (ref 3.4–10.8)

## 2020-02-11 LAB — PROTEIN / CREATININE RATIO, URINE
Creatinine, Urine: 42.9 mg/dL
Protein, Ur: 4.6 mg/dL
Protein/Creat Ratio: 107 mg/g creat (ref 0–200)

## 2020-02-11 NOTE — Telephone Encounter (Signed)
HIPPA compliant message left requesting call back to review results.  Rolm Bookbinder, CNM 02/11/20 2:44 PM

## 2020-02-13 ENCOUNTER — Telehealth: Payer: Self-pay | Admitting: Advanced Practice Midwife

## 2020-02-13 NOTE — Telephone Encounter (Signed)
Received call from [facility] about stat lab results for protein creatinine ratio.  P/C ratio 107, and are wnl on review.  Pt to f/u in office this week as planned. Pt contacted by Cleone Slim, CNM, to review lab results and was not contacted today.

## 2020-02-15 ENCOUNTER — Other Ambulatory Visit: Payer: Self-pay

## 2020-02-15 ENCOUNTER — Ambulatory Visit (INDEPENDENT_AMBULATORY_CARE_PROVIDER_SITE_OTHER): Payer: No Typology Code available for payment source | Admitting: Certified Nurse Midwife

## 2020-02-15 VITALS — BP 129/92 | HR 94 | Wt 176.0 lb

## 2020-02-15 DIAGNOSIS — Z3480 Encounter for supervision of other normal pregnancy, unspecified trimester: Secondary | ICD-10-CM

## 2020-02-15 DIAGNOSIS — Z3A38 38 weeks gestation of pregnancy: Secondary | ICD-10-CM

## 2020-02-15 DIAGNOSIS — Z8759 Personal history of other complications of pregnancy, childbirth and the puerperium: Secondary | ICD-10-CM

## 2020-02-15 DIAGNOSIS — R030 Elevated blood-pressure reading, without diagnosis of hypertension: Secondary | ICD-10-CM

## 2020-02-15 NOTE — Progress Notes (Signed)
      She would like to be check today.

## 2020-02-15 NOTE — Patient Instructions (Addendum)
  Cervical Ripening (to get your cervix ready for labor) : May try one or all:  Red Raspberry Leaf capsules:  two 300mg or 400mg tablets with each meal, 2-3 times a day  Potential Side Effects Of Raspberry Leaf:  Most women do not experience any side effects from drinking raspberry leaf tea. However, nausea and loose stools are possible     Evening Primrose Oil capsules: may take 1 to 3 capsules daily. May also prick one to release the oil and insert it into your vagina at night.  Some of the potential side effects:  Upset stomach  Loose stools or diarrhea  Headaches  Nausea   4 Dates a day (may taste better if warmed in microwave until soft). Found where raisins are in the grocery store   Reasons to go to MAU:  1.  Contractions are  5 minutes apart or less, each last 1 minute, these have been going on for 1-2 hours, and you cannot walk or talk during them 2.  You have a large gush of fluid, or a trickle of fluid that will not stop and you have to wear a pad 3.  You have bleeding that is bright red, heavier than spotting--like menstrual bleeding (spotting can be normal in early labor or after a check of your cervix) 4.  You do not feel the baby moving like he/she normally does  

## 2020-02-15 NOTE — Progress Notes (Signed)
PRENATAL VISIT NOTE  Subjective:  Janice Matthews is a 34 y.o. G3P2002 at [redacted]w[redacted]d being seen today for ongoing prenatal care.  She is currently monitored for the following issues for this low-risk pregnancy and has Supervision of other normal pregnancy, antepartum; History of pregnancy induced hypertension; Choroid plexus cyst of fetus on prenatal ultrasound; and Elevated BP without diagnosis of hypertension on their problem list.  Patient reports occasional contractions.  Contractions: Irritability. Vag. Bleeding: None.  Movement: Present. Denies leaking of fluid.   The following portions of the patient's history were reviewed and updated as appropriate: allergies, current medications, past family history, past medical history, past social history, past surgical history and problem list.   Objective:   Vitals:   02/15/20 1120  BP: (!) 129/92  Pulse: 94  Weight: 176 lb (79.8 kg)    Fetal Status: Fetal Heart Rate (bpm): 142 Fundal Height: 40 cm Movement: Present  Presentation: Vertex  General:  Alert, oriented and cooperative. Patient is in no acute distress.  Skin: Skin is warm and dry. No rash noted.   Cardiovascular: Normal heart rate noted  Respiratory: Normal respiratory effort, no problems with respiration noted  Abdomen: Soft, gravid, appropriate for gestational age.  Pain/Pressure: Present     Pelvic: Cervical exam performed in the presence of a chaperone Dilation: 1.5 Effacement (%): 50 Station: -3  Extremities: Normal range of motion.  Edema: None  Mental Status: Normal mood and affect. Normal behavior. Normal judgment and thought content.   Assessment and Plan:  Pregnancy: G3P2002 at [redacted]w[redacted]d 1. [redacted] weeks gestation of pregnancy  2. Supervision of other normal pregnancy, antepartum - Patient reports occasional contractions and cramping  - Anticipatory guidance on upcoming appointments  - Patient does not want induction at this time, wants induction closer to 41 weeks  - IOL  scheduled for 9/28 - discussed with patient that if induction needs to be moved up for complications or canceled because of SOL then changes can be made  - IOL orders placed - Patient request cervical examination today    3. History of pregnancy induced hypertension  4. Elevated BP without diagnosis of hypertension - Elevated BP in office today, patient had elevated BP in office last week  - Patient denies s/s PEC, PEC labs normal last week  - Patient reports that she is anxious and has possible white coat syndrome at offices  - Patient reports that BPs at home normotensive - patient has been checking BP daily and Babyscripts reviewed and also below  - Discussed with patient possibility of GHTN based on hx and induction, patient does not want induction at this time  - Discussed with patient option of continued BP checks at home and close follow up, with patient presenting to hospital with any s/s of PEC, patient verbalizes understanding and agrees in plan after shared decision making.   Blood Pressure / Weight (Babyscripts)  Date/Time Blood Pressure Weight     02/15/20 13:22:58 113/79 --     02/15/20 13:22:55 113/79 --     02/14/20 13:41:49 114/81 --     02/13/20 16:05:14 108/80 --     02/12/20 15:06:54 107/78 --     02/11/20 14:52:52 111/76 --     02/11/20 14:52:49 111/76 --     02/10/20 17:39:51 112/77 --       Term labor symptoms and general obstetric precautions including but not limited to vaginal bleeding, contractions, leaking of fluid and fetal movement were reviewed in detail with the patient.  Please refer to After Visit Summary for other counseling recommendations.   Return in about 1 week (around 02/22/2020) for ROB, NST.  Future Appointments  Date Time Provider Department Center  02/23/2020  9:10 AM Raelyn Mora, CNM [facility] None  02/28/2020  9:25 AM [facility] SCHED ROOM [facility] None    Sharyon Cable, CNM

## 2020-02-20 ENCOUNTER — Telehealth (HOSPITAL_COMMUNITY): Payer: Self-pay | Admitting: *Deleted

## 2020-02-20 ENCOUNTER — Encounter (HOSPITAL_COMMUNITY): Payer: Self-pay | Admitting: *Deleted

## 2020-02-20 NOTE — Telephone Encounter (Signed)
Preadmission screen  

## 2020-02-21 ENCOUNTER — Other Ambulatory Visit: Payer: Self-pay | Admitting: Family Medicine

## 2020-02-23 ENCOUNTER — Other Ambulatory Visit: Payer: Self-pay

## 2020-02-23 ENCOUNTER — Encounter: Payer: Self-pay | Admitting: General Practice

## 2020-02-23 ENCOUNTER — Inpatient Hospital Stay (HOSPITAL_COMMUNITY)
Admission: AD | Admit: 2020-02-23 | Discharge: 2020-02-25 | DRG: 807 | Disposition: A | Discharge status: Home / Self Care | Payer: No Typology Code available for payment source | Attending: Obstetrics and Gynecology | Admitting: Obstetrics and Gynecology

## 2020-02-23 ENCOUNTER — Ambulatory Visit (INDEPENDENT_AMBULATORY_CARE_PROVIDER_SITE_OTHER): Payer: No Typology Code available for payment source | Admitting: Obstetrics and Gynecology

## 2020-02-23 ENCOUNTER — Encounter (HOSPITAL_COMMUNITY): Payer: Self-pay | Admitting: Obstetrics and Gynecology

## 2020-02-23 VITALS — BP 117/86 | HR 103 | Temp 97.7°F | Wt 179.0 lb

## 2020-02-23 DIAGNOSIS — Z3490 Encounter for supervision of normal pregnancy, unspecified, unspecified trimester: Secondary | ICD-10-CM | POA: Diagnosis present

## 2020-02-23 DIAGNOSIS — Z20822 Contact with and (suspected) exposure to covid-19: Secondary | ICD-10-CM | POA: Diagnosis present

## 2020-02-23 DIAGNOSIS — O3663X0 Maternal care for excessive fetal growth, third trimester, not applicable or unspecified: Secondary | ICD-10-CM | POA: Diagnosis present

## 2020-02-23 DIAGNOSIS — Z3A40 40 weeks gestation of pregnancy: Secondary | ICD-10-CM

## 2020-02-23 DIAGNOSIS — O134 Gestational [pregnancy-induced] hypertension without significant proteinuria, complicating childbirth: Secondary | ICD-10-CM | POA: Diagnosis present

## 2020-02-23 DIAGNOSIS — R030 Elevated blood-pressure reading, without diagnosis of hypertension: Secondary | ICD-10-CM | POA: Diagnosis present

## 2020-02-23 DIAGNOSIS — O139 Gestational [pregnancy-induced] hypertension without significant proteinuria, unspecified trimester: Secondary | ICD-10-CM | POA: Diagnosis present

## 2020-02-23 DIAGNOSIS — Z3480 Encounter for supervision of other normal pregnancy, unspecified trimester: Secondary | ICD-10-CM

## 2020-02-23 DIAGNOSIS — O133 Gestational [pregnancy-induced] hypertension without significant proteinuria, third trimester: Secondary | ICD-10-CM

## 2020-02-23 LAB — PROTEIN / CREATININE RATIO, URINE
Creatinine, Urine: 27.32 mg/dL
Total Protein, Urine: <6 mg/dL

## 2020-02-23 LAB — CBC
HCT: 38.3 % (ref 36.0–46.0)
HCT: 37.8 % (ref 36.0–46.0)
Hemoglobin: 12.8 g/dL (ref 12.0–15.0)
Hemoglobin: 12.9 g/dL (ref 12.0–15.0)
MCH: 30 pg (ref 26.0–34.0)
MCH: 30.4 pg (ref 26.0–34.0)
MCHC: 33.4 g/dL (ref 30.0–36.0)
MCHC: 34.1 g/dL (ref 30.0–36.0)
MCV: 89.7 fL (ref 80.0–100.0)
MCV: 89.2 fL (ref 80.0–100.0)
Platelets: 267 10*3/uL (ref 150–400)
Platelets: 232 10*3/uL (ref 150–400)
RBC: 4.27 MIL/uL (ref 3.87–5.11)
RBC: 4.24 MIL/uL (ref 3.87–5.11)
RDW: 13.2 % (ref 11.5–15.5)
RDW: 13.4 % (ref 11.5–15.5)
WBC: 11.1 10*3/uL — ABNORMAL HIGH (ref 4.0–10.5)
WBC: 12.2 10*3/uL — ABNORMAL HIGH (ref 4.0–10.5)
nRBC: 0 % (ref 0.0–0.2)
nRBC: 0 % (ref 0.0–0.2)

## 2020-02-23 LAB — COMPREHENSIVE METABOLIC PANEL
ALT: 17 U/L (ref 0–44)
AST: 24 U/L (ref 15–41)
Albumin: 2.6 g/dL — ABNORMAL LOW (ref 3.5–5.0)
Alkaline Phosphatase: 200 U/L — ABNORMAL HIGH (ref 38–126)
Anion gap: 10 (ref 5–15)
BUN: 8 mg/dL (ref 6–20)
CO2: 20 mmol/L — ABNORMAL LOW (ref 22–32)
Calcium: 8.8 mg/dL — ABNORMAL LOW (ref 8.9–10.3)
Chloride: 107 mmol/L (ref 98–111)
Creatinine, Ser: 0.66 mg/dL (ref 0.44–1.00)
GFR calc Af Amer: >60 mL/min (ref >60)
GFR calc non Af Amer: >60 mL/min (ref >60)
Glucose, Bld: 105 mg/dL — ABNORMAL HIGH (ref 70–99)
Potassium: 3.8 mmol/L (ref 3.5–5.1)
Sodium: 137 mmol/L (ref 135–145)
Total Bilirubin: 0.5 mg/dL (ref 0.3–1.2)
Total Protein: 5.4 g/dL — ABNORMAL LOW (ref 6.5–8.1)

## 2020-02-23 LAB — RESPIRATORY PANEL BY RT PCR (FLU A&B, COVID)
Influenza A by PCR: NEGATIVE
Influenza B by PCR: NEGATIVE
SARS Coronavirus 2 by RT PCR: NEGATIVE

## 2020-02-23 LAB — TYPE AND SCREEN
ABO/RH(D): O POS
Antibody Screen: NEGATIVE

## 2020-02-23 MED ORDER — OXYTOCIN-SODIUM CHLORIDE 30-0.9 UT/500ML-% IV SOLN
1.0000 m[IU]/min | INTRAVENOUS | Status: DC
  Administered 2020-02-23: 2 m[IU]/min via INTRAVENOUS
  Filled 2020-02-23: qty 500

## 2020-02-23 MED ORDER — OXYTOCIN-SODIUM CHLORIDE 30-0.9 UT/500ML-% IV SOLN
2.5000 [IU]/h | INTRAVENOUS | Status: DC
  Administered 2020-02-24: 2.5 [IU]/h via INTRAVENOUS

## 2020-02-23 MED ORDER — LIDOCAINE HCL (PF) 1 % IJ SOLN: 30.0000 mL | INTRAMUSCULAR | Status: DC | PRN

## 2020-02-23 MED ORDER — SOD CITRATE-CITRIC ACID 500-334 MG/5ML PO SOLN: 30.0000 mL | ORAL | Status: DC | PRN

## 2020-02-23 MED ORDER — LACTATED RINGERS IV SOLN: 500.0000 mL | INTRAVENOUS | Status: DC | PRN

## 2020-02-23 MED ORDER — OXYTOCIN BOLUS FROM INFUSION
333.0000 mL | Freq: Once | INTRAVENOUS | Status: AC
  Administered 2020-02-24: 333 mL via INTRAVENOUS

## 2020-02-23 MED ORDER — LACTATED RINGERS IV SOLN: INTRAVENOUS | Status: DC

## 2020-02-23 MED ORDER — TERBUTALINE SULFATE 1 MG/ML IJ SOLN: 0.2500 mg | Freq: Once | INTRAMUSCULAR | Status: DC | PRN

## 2020-02-23 MED ORDER — ACETAMINOPHEN 325 MG PO TABS: 650.0000 mg | ORAL_TABLET | ORAL | Status: DC | PRN

## 2020-02-23 MED ORDER — ONDANSETRON HCL 4 MG/2ML IJ SOLN: 4.0000 mg | Freq: Four times a day (QID) | INTRAMUSCULAR | Status: DC | PRN

## 2020-02-23 MED ORDER — MISOPROSTOL 25 MCG QUARTER TABLET
ORAL_TABLET | ORAL | Status: AC
  Administered 2020-02-23: 25 ug
  Filled 2020-02-23: qty 1

## 2020-02-23 MED ORDER — MISOPROSTOL 100 MCG PO TABS: 25.0000 ug | ORAL_TABLET | ORAL | Status: DC

## 2020-02-23 NOTE — H&P (Signed)
Janice Matthews is a 34 y.o. female presenting for IOL for new onset elevated blood pressure at [redacted]w[redacted]d. On admission she denies headache, visual disturbances, RUQ/epigastric pain, new onset swelling or weight gain. She also denies vaginal bleeding, leaking of fluid, decreased fetal movement, fever, falls, or recent illness.   Prenatal History --[facility] --Dating by LMP c/w first trimester scan --Low risk NIPS --No referrals --Rubella Immune --TDAP 12/01/2019 --Flu 02/10/2020 --COVID [employer]) 2nd vaccine 09/12/2019  OB History    Gravida  3   Para  2   Term  2   Preterm      AB      Living  2     SAB      TAB      Ectopic      Multiple      Live Births  2          Past Medical History:  Diagnosis Date  . Hypertension    History reviewed. No pertinent surgical history. Family History: family history includes Diabetes in her mother; Thyroid disease in her mother. Social History:  reports that she has never smoked. She has never used smokeless tobacco. She reports previous alcohol use. She reports that she does not use drugs.     Maternal Diabetes: No Genetic Screening: Normal Maternal Ultrasounds/Referrals: Normal  Fetal Ultrasounds or other Referrals:  None Maternal Substance Abuse:  No Significant Maternal Medications:  None Significant Maternal Lab Results:  Group B Strep negative Other Comments:  None  Review of Systems  Eyes: Negative for photophobia.  Gastrointestinal: Negative for abdominal pain.  Musculoskeletal: Negative for back pain.  Neurological: Negative for headaches.  All other systems reviewed and are negative.    Blood pressure (!) 149/94, pulse (!) 105, temperature 99 F (37.2 C), temperature source Oral, resp. rate 16, height 5\' 5"  (1.651 m), weight 81.2 kg, last menstrual period 05/19/2019. Exam Physical Exam Vitals and nursing note reviewed. Exam conducted with a chaperone present.  Cardiovascular:     Rate and Rhythm:  Normal rate.     Pulses: Normal pulses.     Heart sounds: Normal heart sounds.  Pulmonary:     Effort: Pulmonary effort is normal.     Breath sounds: Normal breath sounds.  Abdominal:     Tenderness: There is no abdominal tenderness.     Comments: Gravid  Skin:    General: Skin is warm.     Capillary Refill: Capillary refill takes less than 2 seconds.  Neurological:     General: No focal deficit present.     Mental Status: She is alert.  Psychiatric:        Mood and Affect: Mood normal.        Behavior: Behavior normal.        Thought Content: Thought content normal.        Judgment: Judgment normal.     Prenatal labs: ABO, Rh: O/Positive/-- (03/09 1002) Antibody: Negative (03/09 1002) Rubella: 2.55 (03/09 1002) RPR: Non Reactive (07/01 0839)  HBsAg: Negative (03/09 1002)  HIV: Non Reactive (07/01 0839)  GBS: Negative/-- (08/26 1526)   Assessment/Plan: --34 y.o. G3P2002 at [redacted]w[redacted]d, patient last ate at 1100 --IOL new onset elevated BP --Cat I tracing --GBS Neg --Mild BP elevation, PEC labs ordered --FB and Cytotec #1 placed at 1323 --Considering unmedicated delivery --Anticipate NSVD  Surprise!!/outpt circ if female/breast/outpt IUD   [redacted]w[redacted]d, CNM 02/23/2020, 2:09 PM

## 2020-02-23 NOTE — Progress Notes (Signed)
   LOW-RISK PREGNANCY OFFICE VISIT Patient name: Janice Matthews MRN [id number]  Date of birth: 09-Feb-1986 Chief Complaint:   Routine Prenatal Visit  History of Present Illness:   Fredrica Capano is a 34 y.o. G76P2002 female at [redacted]w[redacted]d with an Estimated Date of Delivery: 02/23/20 being seen today for ongoing management of a low-risk pregnancy.  Today she reports occasional contractions. Being nervous upon arrival to office; which causes elevated BP's. She reports and has documented normal BPs in baby scripts. Contractions: Irregular. Vag. Bleeding: None Movement: Present. denies leaking of fluid. Review of Systems:   Pertinent items are noted in HPI Denies abnormal vaginal discharge w/ itching/odor/irritation, headaches, visual changes, shortness of breath, chest pain, abdominal pain, severe nausea/vomiting, or problems with urination or bowel movements unless otherwise stated above. Pertinent History Reviewed:  Reviewed past medical,surgical, social, obstetrical and family history.  Reviewed problem list, medications and allergies. Physical Assessment:   Vitals:   02/23/20 0918 02/23/20 0928  BP: (!) 135/92 117/86  Pulse: (!) 101 (!) 103  Temp: 97.7 F (36.5 C)   Weight: 179 lb (81.2 kg)   Body mass index is 29.79 kg/m.        Physical Examination:   General appearance: Well appearing, and in no distress  Mental status: Alert, oriented to person, place, and time  Skin: Warm & dry  Cardiovascular: Normal heart rate noted  Respiratory: Normal respiratory effort, no distress  Abdomen: Soft, gravid, nontender  Pelvic: Cervical exam performed  Dilation: 3 Effacement (%): 80 Station: -1BBOW, membranes stripped per pt request, scant amount of blood on exam glove  Extremities: Edema: None  Fetal Status: Fetal Heart Rate (bpm): 148 Fundal Height: 40 cm Movement: Present Presentation: Vertex  REACTIVE NST - FHR: 140 bpm / moderate variability / accels present with scalp stimulation  / decels  absent / TOCO: irregular UC's    Assessment & Plan:  1) Low-risk pregnancy G3P2002 at [redacted]w[redacted]d with an Estimated Date of Delivery: 02/23/20   2) Supervision of other normal pregnancy, antepartum  3) Gestational hypertension, third trimester - Consult with Dr. Alysia Penna @ 1016 - notified of patient's assessments, VS, previous lab & NST results, tx plan direct admit for IOL for gHTN - agrees with plan  - Thalia Bloodgood, CNM made aware of IOL today and report given, Reita Cliche, CNM will assume care upon arrival to L&D unit  4) [redacted] weeks gestation of pregnancy    Meds: No orders of the defined types were placed in this encounter.  Labs/procedures today: NST and cervical exam  Plan:  IOL today  Reviewed: Term labor symptoms and general obstetric precautions including but not limited to vaginal bleeding, contractions, leaking of fluid and fetal movement were reviewed in detail with the patient.  All questions were answered. Has home bp cuff.Check bp weekly, let us know if >140/90.   Follow-up: No follow-ups on file.  No orders of the defined types were placed in this encounter.  Raelyn Mora MSN, CNM 02/23/2020

## 2020-02-23 NOTE — Progress Notes (Signed)
Janice Matthews is a 34 y.o. G3P2002 at [redacted]w[redacted]d   Subjective: Denies pain. Support person at bedside, very engaged in care  Objective: BP 121/86   Pulse 94   Temp 99 F (37.2 C) (Oral)   Resp 16   Ht 5\' 5"  (1.651 m)   Wt 81.2 kg   LMP 05/19/2019 (Exact Date)   BMI 29.80 kg/m  No intake/output data recorded. No intake/output data recorded.  FHT:  FHR: 135 bpm, variability: moderate,  accelerations:  Present,  decelerations:  Absent UC:   irregular, every 2-5 minutes SVE:   Dilation: 4.5 Effacement (%): 70 Engagement: -2 Exam by:: S Capricia Serda CNM  Labs: Lab Results  Component Value Date   WBC 11.1 (H) 02/23/2020   HGB 12.8 02/23/2020   HCT 38.3 02/23/2020   MCV 89.7 02/23/2020   PLT 267 02/23/2020    Assessment / Plan: --34 y.o. G3P2002 at [redacted]w[redacted]d  --GHTN, normotensive after elevated BP x 1 at admission --Cat I tracing --P:Cr below reportable range --S/p Cytotec x 1 and foley balloon --Initiate Pitocin 2 x 2 --Anticipate NSVD  [redacted]w[redacted]d, CNM 02/23/2020, 5:30 PM

## 2020-02-23 NOTE — Progress Notes (Signed)
Vitals:   02/23/20 1900 02/23/20 2034  BP: 118/84 (!) 115/92  Pulse: (!) 108 95  Resp: 16 18  Temp:  97.9 F (36.6 C)  No HA/blurred vision/RUQ pain. PItocin at 12  Mu/.min Ctx mild, q 3-4 minutes.  FHR Cat 1.  cx 5/70/-2.  AROM w/clear fluid.  Continue to increase pitocin.

## 2020-02-24 ENCOUNTER — Encounter (HOSPITAL_COMMUNITY): Payer: Self-pay | Admitting: Obstetrics and Gynecology

## 2020-02-24 DIAGNOSIS — Z3A40 40 weeks gestation of pregnancy: Secondary | ICD-10-CM

## 2020-02-24 LAB — RPR: RPR Ser Ql: NONREACTIVE

## 2020-02-24 MED ORDER — ONDANSETRON HCL 4 MG PO TABS: 4.0000 mg | ORAL_TABLET | ORAL | Status: DC | PRN

## 2020-02-24 MED ORDER — WITCH HAZEL-GLYCERIN EX PADS: 1.0000 "application " | MEDICATED_PAD | CUTANEOUS | Status: DC | PRN

## 2020-02-24 MED ORDER — ZOLPIDEM TARTRATE 5 MG PO TABS: 5.0000 mg | ORAL_TABLET | Freq: Every evening | ORAL | Status: DC | PRN

## 2020-02-24 MED ORDER — BISACODYL 10 MG RE SUPP: 10.0000 mg | Freq: Every day | RECTAL | Status: DC | PRN

## 2020-02-24 MED ORDER — FENTANYL CITRATE (PF) 100 MCG/2ML IJ SOLN
100.0000 ug | INTRAMUSCULAR | Status: DC | PRN
  Filled 2020-02-24: qty 2

## 2020-02-24 MED ORDER — PRENATAL MULTIVITAMIN CH
1.0000 | ORAL_TABLET | Freq: Every day | ORAL | Status: DC
  Administered 2020-02-24 – 2020-02-25 (×2): 1 via ORAL
  Filled 2020-02-24 (×2): qty 1

## 2020-02-24 MED ORDER — FLEET ENEMA 7-19 GM/118ML RE ENEM: 1.0000 | ENEMA | Freq: Every day | RECTAL | Status: DC | PRN

## 2020-02-24 MED ORDER — SIMETHICONE 80 MG PO CHEW: 80.0000 mg | CHEWABLE_TABLET | ORAL | Status: DC | PRN

## 2020-02-24 MED ORDER — TETANUS-DIPHTH-ACELL PERTUSSIS 5-2.5-18.5 LF-MCG/0.5 IM SUSP: 0.5000 mL | Freq: Once | INTRAMUSCULAR | Status: DC

## 2020-02-24 MED ORDER — METHYLERGONOVINE MALEATE 0.2 MG PO TABS: 0.2000 mg | ORAL_TABLET | ORAL | Status: DC | PRN

## 2020-02-24 MED ORDER — MEASLES, MUMPS & RUBELLA VAC IJ SOLR: 0.5000 mL | Freq: Once | INTRAMUSCULAR | Status: DC

## 2020-02-24 MED ORDER — DIBUCAINE (PERIANAL) 1 % EX OINT: 1.0000 "application " | TOPICAL_OINTMENT | CUTANEOUS | Status: DC | PRN

## 2020-02-24 MED ORDER — ONDANSETRON HCL 4 MG/2ML IJ SOLN: 4.0000 mg | INTRAMUSCULAR | Status: DC | PRN

## 2020-02-24 MED ORDER — METHYLERGONOVINE MALEATE 0.2 MG/ML IJ SOLN: 0.2000 mg | INTRAMUSCULAR | Status: DC | PRN

## 2020-02-24 MED ORDER — COCONUT OIL OIL: 1.0000 "application " | TOPICAL_OIL | Status: DC | PRN

## 2020-02-24 MED ORDER — DIPHENHYDRAMINE HCL 25 MG PO CAPS: 25.0000 mg | ORAL_CAPSULE | Freq: Four times a day (QID) | ORAL | Status: DC | PRN

## 2020-02-24 MED ORDER — DOCUSATE SODIUM 100 MG PO CAPS
100.0000 mg | ORAL_CAPSULE | Freq: Two times a day (BID) | ORAL | Status: DC
  Administered 2020-02-24 – 2020-02-25 (×2): 100 mg via ORAL
  Filled 2020-02-24 (×2): qty 1

## 2020-02-24 MED ORDER — FERROUS SULFATE 325 (65 FE) MG PO TABS
325.0000 mg | ORAL_TABLET | Freq: Two times a day (BID) | ORAL | Status: DC
  Administered 2020-02-24 – 2020-02-25 (×3): 325 mg via ORAL
  Filled 2020-02-24 (×3): qty 1

## 2020-02-24 MED ORDER — IBUPROFEN 600 MG PO TABS
600.0000 mg | ORAL_TABLET | Freq: Four times a day (QID) | ORAL | Status: DC
  Administered 2020-02-24 – 2020-02-25 (×6): 600 mg via ORAL
  Filled 2020-02-24 (×6): qty 1

## 2020-02-24 MED ORDER — BENZOCAINE-MENTHOL 20-0.5 % EX AERO: 1.0000 "application " | INHALATION_SPRAY | CUTANEOUS | Status: DC | PRN

## 2020-02-24 MED ORDER — ACETAMINOPHEN 325 MG PO TABS
650.0000 mg | ORAL_TABLET | ORAL | Status: DC | PRN
  Administered 2020-02-24: 650 mg via ORAL
  Filled 2020-02-24: qty 2

## 2020-02-24 MED ORDER — FENTANYL CITRATE (PF) 100 MCG/2ML IJ SOLN
INTRAMUSCULAR | Status: AC
  Administered 2020-02-24: 100 ug via INTRAVENOUS
  Filled 2020-02-24: qty 2

## 2020-02-24 NOTE — Discharge Summary (Signed)
Postpartum Discharge Summary     Patient Name: Janice Matthews DOB: 11-15-1985 MRN: [id number]  Date of admission: 02/23/2020 Delivery date:02/24/2020  Delivering provider: Christin Fudge  Date of discharge: 02/25/2020  Admitting diagnosis: Encounter for induction of labor [Z34.90] Intrauterine pregnancy: [redacted]w[redacted]d    Secondary diagnosis:  Active Problems:   Encounter for induction of labor   Gestational hypertension   Infant large for gestational age  Additional problems: none    Discharge diagnosis: Term Pregnancy Delivered and Gestational Hypertension                                              Post partum procedures:none Augmentation: AROM, Pitocin, Cytotec and IP Foley Complications: None  Hospital course: Induction of Labor With Vaginal Delivery   34y.o. yo G3P2002 at 486w1das admitted to the hospital 02/23/2020 for induction of labor.  Indication for induction: Gestational hypertension.  Patient had an uncomplicated labor course as follows: Membrane Rupture Time/Date: 8:57 PM ,02/23/2020   Delivery Method:Vaginal, Spontaneous  Episiotomy: None  Lacerations:  None  Details of delivery can be found in separate delivery note.  Patient had a routine postpartum course. On PPD#0 she had two mild range BP elevations, but was normotensive and asymptomatic on PPD#1 without medication. Patient is discharged home 02/25/20.  Newborn Data: Birth date:02/24/2020  Birth time:1:39 AM  Gender:Female  Living status:Living  Apgars:8 ,9  Weight:4255 g   Magnesium Sulfate received: No BMZ received: No Rhophylac:N/A MMR:N/A T-DaP:Given prenatally Flu: N/A Transfusion:No  Physical exam  Vitals:   02/24/20 1300 02/24/20 1712 02/24/20 2017 02/25/20 0549  BP: (!) 127/95 (!) 140/93 118/88 115/88  Pulse: (!) 57 63 72 64  Resp: 16 18 18 18   Temp: 98.2 F (36.8 C) 97.6 F (36.4 C) 97.7 F (36.5 C) 97.7 F (36.5 C)  TempSrc: Oral Oral Oral Oral  SpO2: 100% 100% 100% 100%   Weight:      Height:       General: alert and cooperative Lochia: appropriate Uterine Fundus: firm Incision: N/A DVT Evaluation: No evidence of DVT seen on physical exam. Labs: Lab Results  Component Value Date   WBC 12.2 (H) 02/23/2020   HGB 12.9 02/23/2020   HCT 37.8 02/23/2020   MCV 89.2 02/23/2020   PLT 232 02/23/2020   CMP Latest Ref Rng & Units 02/23/2020  Glucose 70 - 99 mg/dL 105(H)  BUN 6 - 20 mg/dL 8  Creatinine 0.44 - 1.00 mg/dL 0.66  Sodium 135 - 145 mmol/L 137  Potassium 3.5 - 5.1 mmol/L 3.8  Chloride 98 - 111 mmol/L 107  CO2 22 - 32 mmol/L 20(L)  Calcium 8.9 - 10.3 mg/dL 8.8(L)  Total Protein 6.5 - 8.1 g/dL 5.4(L)  Total Bilirubin 0.3 - 1.2 mg/dL 0.5  Alkaline Phos 38 - 126 U/L 200(H)  AST 15 - 41 U/L 24  ALT 0 - 44 U/L 17   Edinburgh Score: Edinburgh Postnatal Depression Scale Screening Tool 02/24/2020  I have been able to laugh and see the funny side of things. (No Data)     After visit meds:  Allergies as of 02/25/2020   No Known Allergies     Medication List    STOP taking these medications   aspirin EC 81 MG tablet   Blood Pressure Monitor Automat Devi     TAKE these medications  acetaminophen 325 MG tablet Commonly known as: Tylenol Take 2 tablets (650 mg total) by mouth every 4 (four) hours as needed (for pain scale < 4).   calcium carbonate 500 MG chewable tablet Commonly known as: TUMS - dosed in mg elemental calcium Chew 1,000 mg by mouth 3 (three) times daily as needed for indigestion or heartburn.   famotidine 40 MG tablet Commonly known as: PEPCID Take 40 mg by mouth daily as needed for heartburn or indigestion.   ferrous sulfate 325 (65 FE) MG tablet Take 1 tablet (325 mg total) by mouth daily with breakfast.   ibuprofen 600 MG tablet Commonly known as: ADVIL Take 1 tablet (600 mg total) by mouth every 6 (six) hours as needed.   multivitamin-prenatal 27-0.8 MG Tabs tablet Take 1 tablet by mouth daily at 12 noon.         Discharge home in stable condition Infant Feeding: Breast Infant Disposition:home with mother Discharge instruction: per After Visit Summary and Postpartum booklet. Activity: Advance as tolerated. Pelvic rest for 6 weeks.  Diet: routine diet Future Appointments: No future appointments. Follow up Visit:  Follow-up Information    [place].   Specialty: Obstetrics and Gynecology Why: You will be contacted with a postpartum appointment for in 4 weeks. Please call if you're having elevated blood pressures at home >140/90. Contact information: [place] [phone]               Please schedule this patient for a Virtual postpartum visit in 4 weeks with the following provider: APP. Additional Postpartum F/U:  Low risk pregnancy complicated by: HTN Delivery mode:  Vaginal, Spontaneous  Anticipated Birth Control:  IUD   02/25/2020 Myrtis Ser, CNM  8:53 AM

## 2020-02-24 NOTE — Lactation Note (Signed)
This note was copied from a baby's chart. Lactation Consultation Note  Patient Name: Janice Matthews [id number] Date: 02/24/2020 Reason for consult: Other (Comment) (mom declined LC - P3 , and only wants information on her UMR Benefits DEBP .)   Maternal Data    Feeding Feeding Type: Breast Fed  LATCH Score                   Interventions    Lactation Tools Discussed/Used     Consult Status Consult Status: Follow-up (mom will call when she decides what DEBP for Benefits) Date: 02/24/20 Follow-up type: In-patient    Janice Matthews 02/24/2020, 2:24 PM

## 2020-02-25 ENCOUNTER — Other Ambulatory Visit (HOSPITAL_COMMUNITY): Payer: No Typology Code available for payment source

## 2020-02-25 MED ORDER — IBUPROFEN 600 MG PO TABS
600.0000 mg | ORAL_TABLET | Freq: Four times a day (QID) | ORAL | 0 refills | Status: DC | PRN
Start: 2020-02-25 — End: 2020-10-22

## 2020-02-25 MED ORDER — FERROUS SULFATE 325 (65 FE) MG PO TABS
325.0000 mg | ORAL_TABLET | Freq: Every day | ORAL | 0 refills | Status: DC
Start: 2020-02-25 — End: 2020-10-22

## 2020-02-25 MED ORDER — ACETAMINOPHEN 325 MG PO TABS: 650.0000 mg | ORAL_TABLET | ORAL | Status: DC | PRN

## 2020-02-25 NOTE — Lactation Note (Signed)
This note was copied from a baby's chart. Lactation Consultation Note  Patient Name: Janice Matthews Date: 02/25/2020   [facility] to visit mom regarding DEBP.  Mom was already given her Sonata breastpump yesterday.  Denies need for lactation services.  Maternal Data    Feeding Feeding Type: Breast Fed  LATCH Score Latch: Grasps breast easily, tongue down, lips flanged, rhythmical sucking.  Audible Swallowing: Spontaneous and intermittent  Type of Nipple: Everted at rest and after stimulation  Comfort (Breast/Nipple): Soft / non-tender  Hold (Positioning): No assistance needed to correctly position infant at breast.  LATCH Score: 10  Interventions    Lactation Tools Discussed/Used     Consult Status      Huntington Leverich Michaelle Copas 02/25/2020, 8:02 AM

## 2020-02-25 NOTE — Discharge Instructions (Signed)

## 2020-02-28 ENCOUNTER — Inpatient Hospital Stay (HOSPITAL_COMMUNITY): Payer: No Typology Code available for payment source

## 2020-03-29 ENCOUNTER — Other Ambulatory Visit (HOSPITAL_COMMUNITY)
Admission: RE | Admit: 2020-03-29 | Discharge: 2020-03-29 | Disposition: A | Discharge status: Home / Self Care | Payer: No Typology Code available for payment source | Source: Ambulatory Visit | Attending: Obstetrics and Gynecology | Admitting: Obstetrics and Gynecology

## 2020-03-29 ENCOUNTER — Encounter: Payer: Self-pay | Admitting: Obstetrics and Gynecology

## 2020-03-29 ENCOUNTER — Encounter: Payer: Self-pay | Admitting: General Practice

## 2020-03-29 ENCOUNTER — Other Ambulatory Visit: Payer: Self-pay

## 2020-03-29 ENCOUNTER — Ambulatory Visit (INDEPENDENT_AMBULATORY_CARE_PROVIDER_SITE_OTHER): Payer: No Typology Code available for payment source | Admitting: Obstetrics and Gynecology

## 2020-03-29 DIAGNOSIS — Z3202 Encounter for pregnancy test, result negative: Secondary | ICD-10-CM | POA: Diagnosis not present

## 2020-03-29 DIAGNOSIS — Z30430 Encounter for insertion of intrauterine contraceptive device: Secondary | ICD-10-CM | POA: Diagnosis not present

## 2020-03-29 DIAGNOSIS — Z124 Encounter for screening for malignant neoplasm of cervix: Secondary | ICD-10-CM | POA: Insufficient documentation

## 2020-03-29 DIAGNOSIS — Z30014 Encounter for initial prescription of intrauterine contraceptive device: Secondary | ICD-10-CM

## 2020-03-29 LAB — POCT URINE PREGNANCY: Preg Test, Ur: NEGATIVE

## 2020-03-29 MED ORDER — PARAGARD INTRAUTERINE COPPER IU IUD
INTRAUTERINE_SYSTEM | Freq: Once | INTRAUTERINE | Status: AC
  Administered 2020-03-29: 1 via INTRAUTERINE

## 2020-03-29 NOTE — Progress Notes (Signed)
    IUD INSERTION PROCEDURE NOTE  Janice Matthews is a 34 y.o. [phone] here for Paragard IUD insertion. No GYN concerns.   She was counseled regarding the risks/benefits of IUD including insertion risk of infection, hemorrhage, damage to surrounding tissue and organs, uterine perforation. She was counseled regarding risks of IUD including implantation into uterine wall, migration outside of uterus, possible need for hysteroscopic or laparoscopic removal, ovarian cysts, expulsion. She was advised that risk of pregnancy is low with negative UPT but is not zero and IUD insertion may cause miscarriage. Reviewed that she is also at slightly higher risk for ectopic pregnancy and she should take a pregnancy test if she believes she may be pregnant. She was advised to use backup method of protection for one week. She verbalized understanding of all of the above and consent signed.   Last intercourse was 1 week ago and protected Last pap smear was on >3 yrs ago and was normal UPT today: NEGATIVE  IUD Insertion  Patient identified and an adequate time out was performed. Speculum placed in the vagina. The cervix was cleaned with Betadine x 2 and grasped anteriorly with a single tooth tenaculum.  A uterine sound was used to sound the uterus to 7 cm;  the IUD was then placed per manufacturer's recommendations. Strings trimmed to 3 cm. Tenaculum was removed, good hemostasis noted. Patient tolerated procedure well.   Patient was given post-procedure instructions.  She was reminded to have backup contraception for one week during this transition period between IUDs.  Patient was also asked to check IUD strings periodically and follow up in 4 weeks for IUD check.  Paragard IUD Exp: 11/2024 Lot: [id number]  Raelyn Mora, MSN, CNM 03/29/2020 11:00 AM

## 2020-03-29 NOTE — Progress Notes (Signed)
Post Partum Visit Note  Janice Matthews is a 34 y.o. G60P3003 female who presents for a postpartum visit. She is 5 weeks postpartum following a normal spontaneous vaginal delivery.  I have fully reviewed the prenatal and intrapartum course. The delivery was at 40.1 gestational weeks.  Anesthesia: none. Postpartum course has been uncomplicated. Baby is doing well; weighing over 11 lbs now. Baby is feeding by breast. Bleeding no bleeding. Bowel function is normal. Bladder function is normal. Patient is sexually active. Contraception method is none. Postpartum depression screening: negative.   The pregnancy intention screening data noted above was reviewed. Potential methods of contraception were discussed. The patient elected to proceed with IUD or IUS.    Edinburgh Postnatal Depression Scale - 03/29/20 1057      Edinburgh Postnatal Depression Scale:  In the Past 7 Days   I have been able to laugh and see the funny side of things. 0    I have looked forward with enjoyment to things. 0    I have blamed myself unnecessarily when things went wrong. 0    I have been anxious or worried for no good reason. 1    I have felt scared or panicky for no good reason. 0    Things have been getting on top of me. 1    I have been so unhappy that I have had difficulty sleeping. 0    I have felt sad or miserable. 1    I have been so unhappy that I have been crying. 0    The thought of harming myself has occurred to me. 0    Edinburgh Postnatal Depression Scale Total 3            The following portions of the patient's history were reviewed and updated as appropriate: allergies, current medications, past family history, past medical history, past social history, past surgical history and problem list.  Review of Systems Constitutional: negative Eyes: negative Ears, nose, mouth, throat, and face: negative Respiratory: negative Cardiovascular: negative Gastrointestinal:  negative Genitourinary:negative Integument/breast: negative Hematologic/lymphatic: negative Musculoskeletal:negative Neurological: negative Behavioral/Psych: negative Endocrine: negative Allergic/Immunologic: negative    Objective:  unknown if currently breastfeeding.  General:  alert, cooperative and no distress   Breasts:  inspection negative, no nipple discharge or bleeding, no masses or nodularity palpable  Lungs: not examined  Heart:  not examined  Abdomen: not examined   Vulva:  not evaluated  Vagina: not evaluated  Cervix:  not evaluated  Corpus: not examined  Adnexa:  not evaluated  Rectal Exam: Not performed.        Assessment:  Postpartum examination following vaginal delivery  - POCT urine pregnancy,  - Cytology - PAP( [place])  Encounter for insertion of ParaGard IUD  - POCT urine pregnancy,  - paragard intrauterine copper IUD  Pap smear for cervical cancer screening  - Cytology - PAP( [place])  Encounter for initial prescription of intrauterine contraceptive device (IUD)  - Normal postpartum exam. Pap smear done at today's visit.   Plan:   Essential components of care per ACOG recommendations:  1.  Mood and well being: Patient with negative depression screening today. Reviewed local resources for support.  - Patient does not use tobacco.  - hx of drug use? No    2. Infant care and feeding:  -Patient currently breastmilk feeding? Yes If breastmilk feeding discussed return to work and pumping. If needed, patient was provided letter for work to allow for every 2-3 hr  pumping breaks, and to be granted a private location to express breastmilk and refrigerated area to store breastmilk. Reviewed importance of draining breast regularly to support lactation. -Social determinants of health (SDOH) reviewed in EPIC. No concerns  3. Sexuality, contraception and birth spacing - Patient does not want a pregnancy in the next year.  Desired family size is 3  children.  - Reviewed forms of contraception in tiered fashion. Patient desired IUD today.   - Discussed birth spacing of 18 months  4. Sleep and fatigue -Encouraged family/partner/community support of 4 hrs of uninterrupted sleep to help with mood and fatigue  5. Physical Recovery  - Discussed patients delivery and complications - Patient had no laceration, perineal healing reviewed. Patient expressed understanding - Patient has urinary incontinence? No - Patient is safe to resume physical and sexual activity  6.  Health Maintenance - Last pap smear done >3 years afo and was normal with negative HPV. No Mammogram  7. No Chronic Disease - PCP follow up  Raelyn Mora, CNM [facility] [employer], [facility]

## 2020-03-29 NOTE — Patient Instructions (Signed)

## 2020-03-30 LAB — CYTOLOGY - PAP
Comment: NEGATIVE
Diagnosis: NEGATIVE
High risk HPV: NEGATIVE

## 2020-05-02 ENCOUNTER — Ambulatory Visit: Payer: No Typology Code available for payment source | Admitting: Advanced Practice Midwife

## 2020-05-31 ENCOUNTER — Other Ambulatory Visit (INDEPENDENT_AMBULATORY_CARE_PROVIDER_SITE_OTHER): Payer: No Typology Code available for payment source | Admitting: Obstetrics and Gynecology

## 2020-05-31 DIAGNOSIS — N61 Mastitis without abscess: Secondary | ICD-10-CM | POA: Insufficient documentation

## 2020-05-31 MED ORDER — DICLOXACILLIN SODIUM 500 MG PO CAPS: 500.0000 mg | ORAL_CAPSULE | Freq: Four times a day (QID) | ORAL | 0 refills | Status: DC

## 2020-05-31 MED FILL — DICLOXACILLIN 250 MG CAP: 250 | 10 days supply | Qty: 80 | Fill #0

## 2020-05-31 NOTE — Progress Notes (Signed)
Pt called in with mastitis symptoms with fever.  She is requesting antibiotics

## 2020-07-23 IMAGING — US US MFM OB FOLLOW-UP
1 series · 14 of 28 positions shown · non-contrast
Comparison: none

[Series 1: us mfm ob follow-up · 54 acquisitions, 14 frames shown]
[im 2/54]
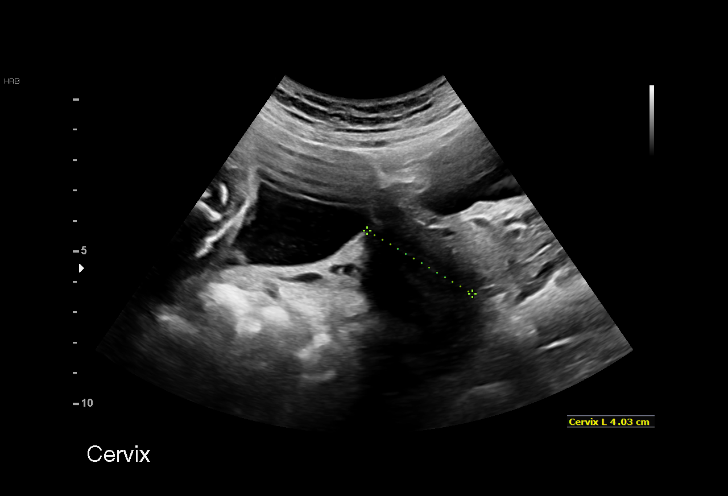
[im 6/54]
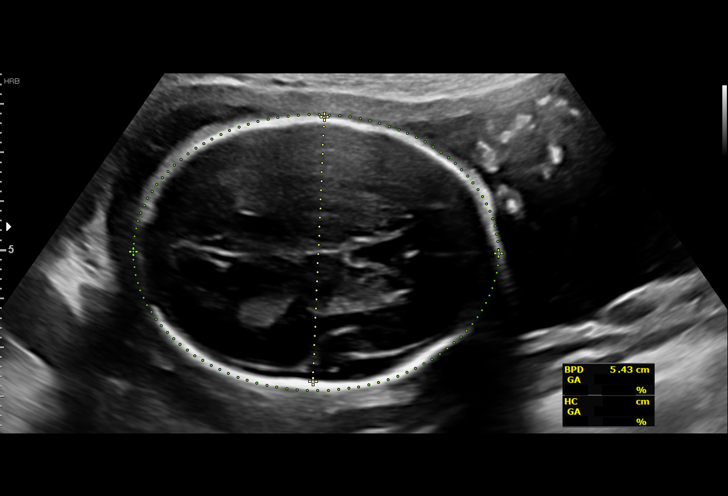
[im 10/54]
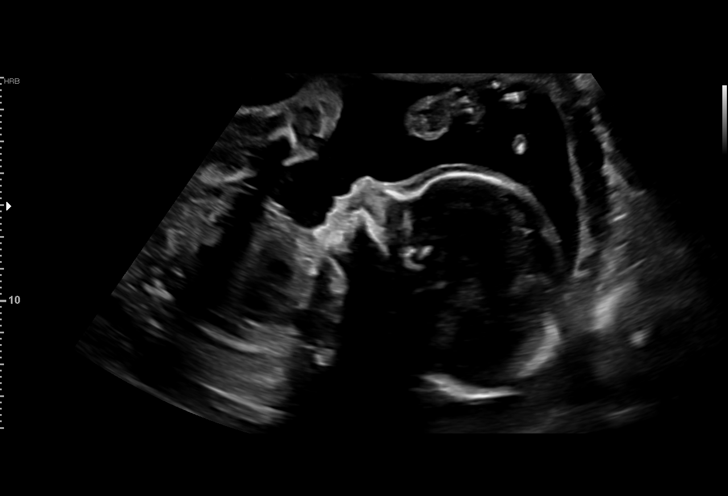
[im 14/54]
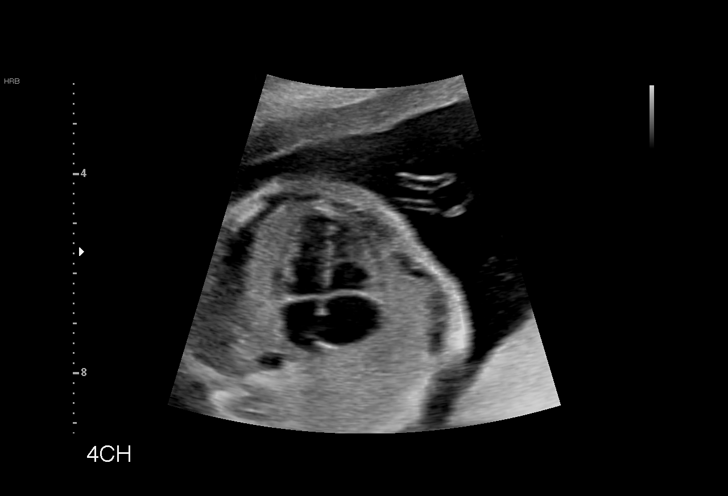
[im 18/54]
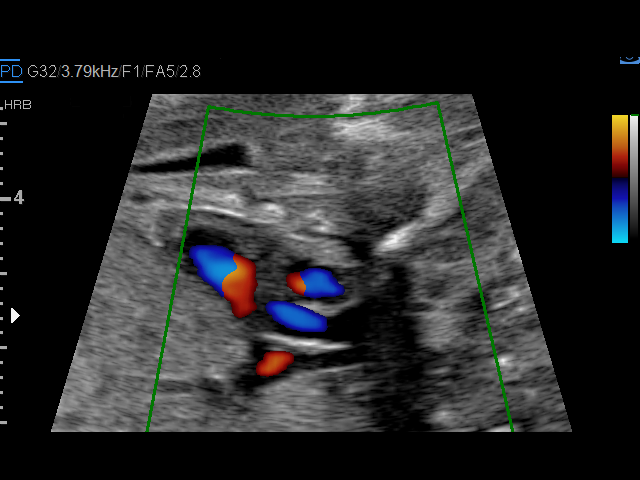
[im 22/54]
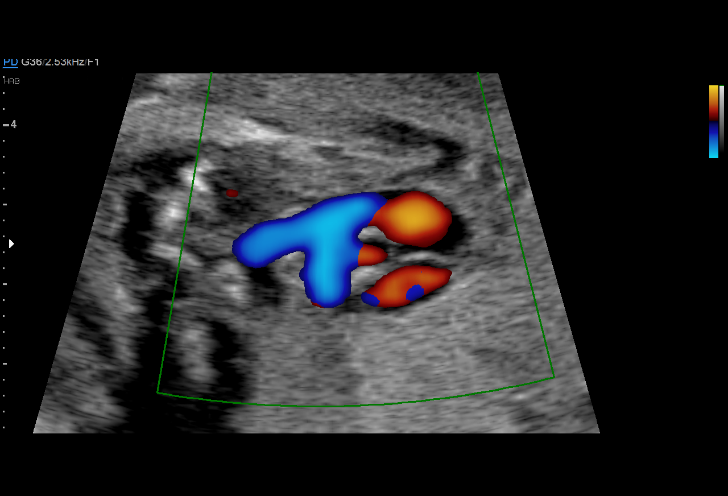
[im 26/54]
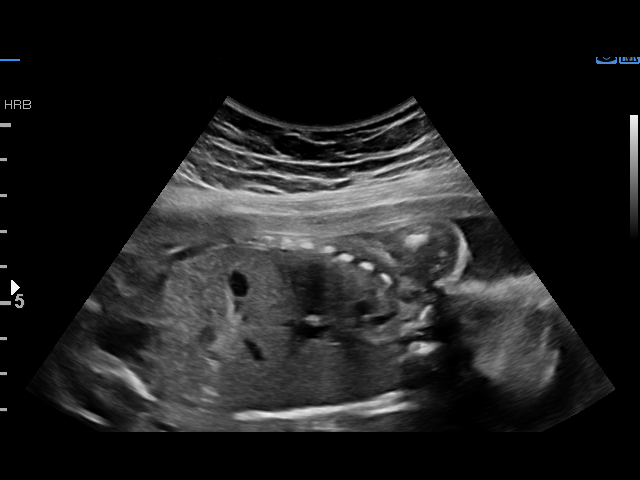
[im 30/54]
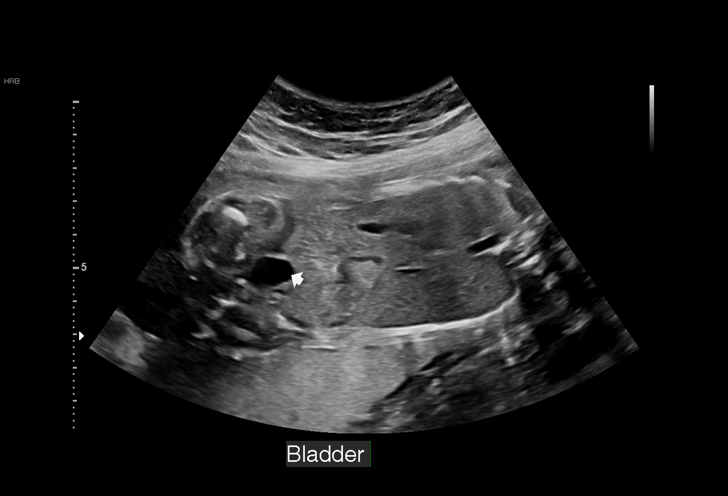
[im 34/54]
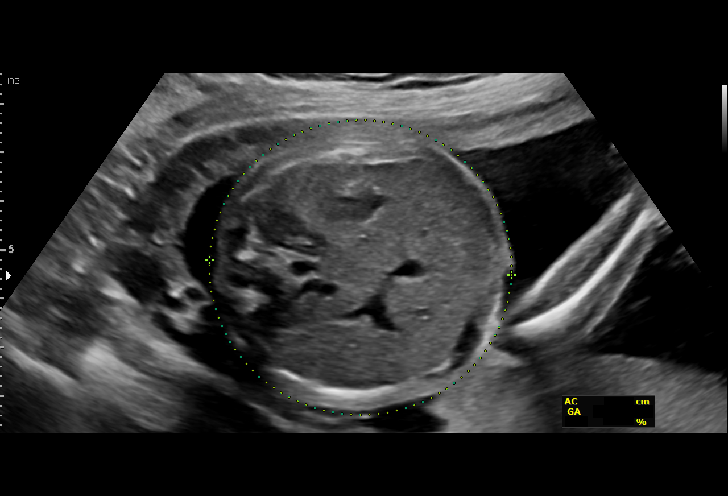
[im 38/54]
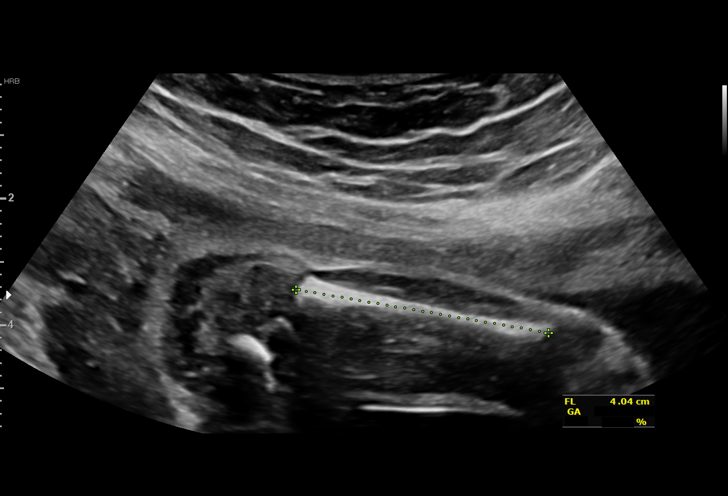
[im 42/54]
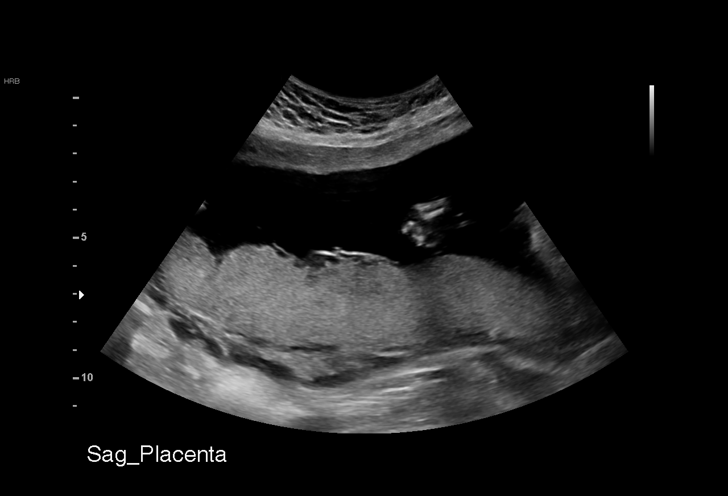
[im 46/54]
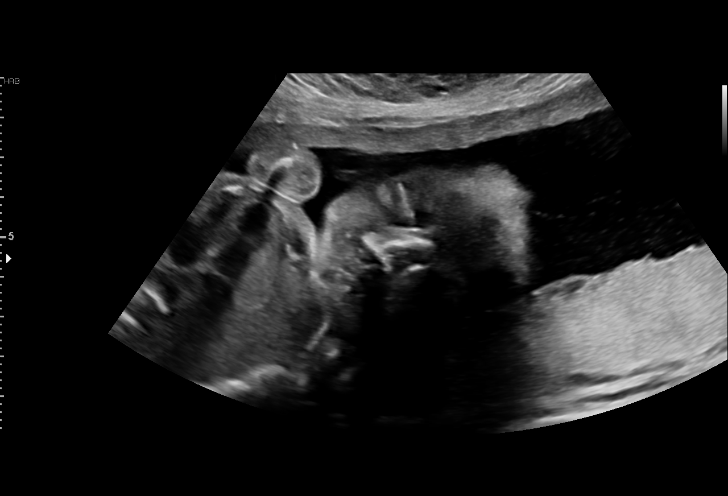
[im 50/54]
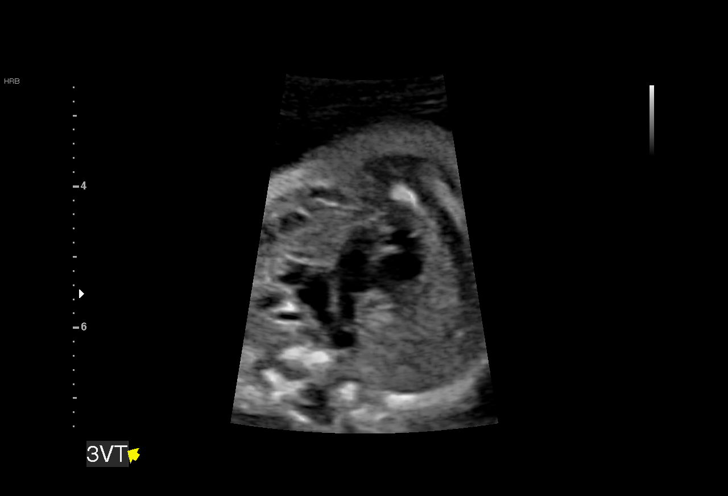
[im 54/54]
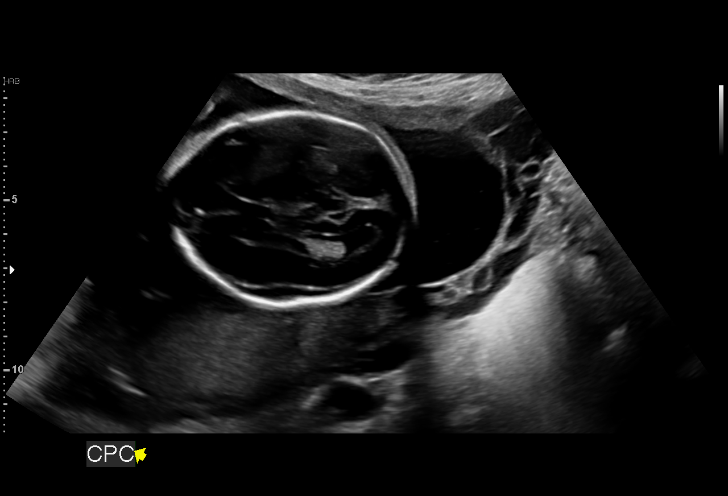

[14 of 28 positions shown; findings below may reference images not displayed]

Indications

 Antenatal follow-up for nonvisualized fetal
 anatomy
 Low risk NIPS,
 Fetal choroid plexus cyst
 Poor obstetric history: Previous
 preeclampsia / eclampsia/gestational HTN
 22 weeks gestation of pregnancy
Fetal Evaluation

 Num Of Fetuses:         1
 Cardiac Activity:       Observed
 Presentation:           Cephalic
 Placenta:               Posterior
 P. Cord Insertion:      Visualized, central

 Amniotic Fluid
 AFI FV:      Within normal limits

                             Largest Pocket(cm)

Biometry

 BPD:      54.5  mm     G. Age:  22w 4d         47  %    CI:        70.46   %    70 - 86
                                                         FL/HC:      19.3   %    19.2 -
 HC:       207   mm     G. Age:  22w 6d         46  %    HC/AC:      1.08        1.05 -
 AC:       191   mm     G. Age:  23w 6d         80  %    FL/BPD:     73.2   %    71 - 87
 FL:       39.9  mm     G. Age:  22w 6d         49  %    FL/AC:      20.9   %    20 - 24
 LV:        6.1  mm

 Est. FW:     583  gm      1 lb 5 oz     80  %
OB History

 Gravidity:    3         Term:   2        Prem:   0        SAB:   0
 TOP:          0       Ectopic:  0        Living: 2
Gestational Age

 LMP:           22w 4d        Date:  05/19/19                 EDD:   02/23/20
 U/S Today:     23w 0d                                        EDD:   02/20/20
 Best:          22w 4d     Det. By:  LMP  (05/19/19)          EDD:   02/23/20
Anatomy

 Cranium:               Appears normal         Aortic Arch:            Previously seen
 Cavum:                 Appears normal         Ductal Arch:            Appears normal
 Ventricles:            Appears normal         Diaphragm:              Appears normal
 Choroid Plexus:        RESOLVED CPCs          Stomach:                Appears normal, left
                                                                       sided
 Cerebellum:            Previously seen        Abdomen:                Appears normal
 Posterior Fossa:       Previously seen        Abdominal Wall:         Previously seen
 Nuchal Fold:           Previously seen        Cord Vessels:           Previously seen
 Face:                  Appears normal         Kidneys:                Appear normal
                        (orbits and profile)
 Lips:                  Previously seen        Bladder:                Appears normal
 Thoracic:              Appears normal         Spine:                  Previously seen
 Heart:                 Appears normal         Upper Extremities:      Previously seen
                        (4CH, axis, and
                        situs)
 RVOT:                  Appears normal         Lower Extremities:      Previously seen
 LVOT:                  Appears normal

 Other:  Parents do not wish to know sex of fetus. Heels previously visualized.
         Rt 5th digit previously visualized.
Cervix Uterus Adnexa

 Cervix
 Length:           4.03  cm.
 Normal appearance by transabdominal scan.
Impression

 Patient returned for completion of fetal anatomy .Amniotic
 fluid is normal and good fetal activity is seen .Fetal growth is
 appropriate for gestational age .Fetal anatomical survey was
 completed and appears normal.
 Left choroid plexus cyst seen on previous scan has resolved.

 We reassured the patient of the findings.
Recommendations

 Follow-up scans as clinically indicated.
                 Power, Yasseen

## 2020-10-22 ENCOUNTER — Encounter: Payer: No Typology Code available for payment source | Admitting: Family Medicine

## 2020-10-22 ENCOUNTER — Other Ambulatory Visit: Payer: Self-pay

## 2020-10-22 ENCOUNTER — Ambulatory Visit (INDEPENDENT_AMBULATORY_CARE_PROVIDER_SITE_OTHER): Payer: No Typology Code available for payment source | Admitting: Family Medicine

## 2020-10-22 VITALS — BP 114/76 | HR 82 | Temp 97.5°F | Ht 65.0 in | Wt 149.2 lb

## 2020-10-22 DIAGNOSIS — Z789 Other specified health status: Secondary | ICD-10-CM | POA: Diagnosis not present

## 2020-10-22 DIAGNOSIS — Z13220 Encounter for screening for lipoid disorders: Secondary | ICD-10-CM | POA: Diagnosis not present

## 2020-10-22 DIAGNOSIS — O139 Gestational [pregnancy-induced] hypertension without significant proteinuria, unspecified trimester: Secondary | ICD-10-CM

## 2020-10-22 DIAGNOSIS — Z0001 Encounter for general adult medical examination with abnormal findings: Secondary | ICD-10-CM | POA: Diagnosis not present

## 2020-10-22 LAB — COMPREHENSIVE METABOLIC PANEL
ALT: 13 U/L (ref 0–35)
AST: 19 U/L (ref 0–37)
Albumin: 4.5 g/dL (ref 3.5–5.2)
Alkaline Phosphatase: 145 U/L — ABNORMAL HIGH (ref 39–117)
BUN: 17 mg/dL (ref 6–23)
CO2: 23 mEq/L (ref 19–32)
Calcium: 9.7 mg/dL (ref 8.4–10.5)
Chloride: 105 mEq/L (ref 96–112)
Creatinine, Ser: 1.04 mg/dL (ref 0.40–1.20)
GFR: 69.72 mL/min (ref >60.00)
Glucose, Bld: 92 mg/dL (ref 70–99)
Potassium: 4.3 mEq/L (ref 3.5–5.1)
Sodium: 140 mEq/L (ref 135–145)
Total Bilirubin: 0.5 mg/dL (ref 0.2–1.2)
Total Protein: 6.8 g/dL (ref 6.0–8.3)

## 2020-10-22 LAB — LIPID PANEL
Cholesterol: 177 mg/dL (ref 0–200)
HDL: 78.7 mg/dL (ref >39.00)
LDL Cholesterol: 65 mg/dL (ref 0–99)
NonHDL: 98.28
Total CHOL/HDL Ratio: 2
Triglycerides: 167 mg/dL — ABNORMAL HIGH (ref 0.0–149.0)
VLDL: 33.4 mg/dL (ref 0.0–40.0)

## 2020-10-22 LAB — CBC
HCT: 42 % (ref 36.0–46.0)
Hemoglobin: 14.7 g/dL (ref 12.0–15.0)
MCHC: 34.9 g/dL (ref 30.0–36.0)
MCV: 87.1 fl (ref 78.0–100.0)
Platelets: 354 10*3/uL (ref 150.0–400.0)
RBC: 4.83 Mil/uL (ref 3.87–5.11)
RDW: 12.5 % (ref 11.5–15.5)
WBC: 8.4 10*3/uL (ref 4.0–10.5)

## 2020-10-22 LAB — TSH: TSH: 1.4 u[IU]/mL (ref 0.35–4.50)

## 2020-10-22 LAB — VITAMIN B12: Vitamin B-12: 333 pg/mL (ref 211–911)

## 2020-10-22 LAB — VITAMIN D 25 HYDROXY (VIT D DEFICIENCY, FRACTURES): VITD: 26.73 ng/mL — ABNORMAL LOW (ref 30.00–100.00)

## 2020-10-22 NOTE — Patient Instructions (Signed)
It was very nice to see you today!  We will check blood work.  Keep up the good work with your diet and exercise.  I will see you back in year.  Come back to see me sooner if needed.  Take care, Dr Jerline Pain  PLEASE NOTE:  If you had any lab tests please let us know if you have not heard back within a few days. You may see your results on mychart before we have a chance to review them but we will give you a call once they are reviewed by [place]. If we ordered any referrals today, please let us know if you have not heard from their office within the next week.   Please try these tips to maintain a healthy lifestyle:   Eat at least 3 REAL meals and 1-2 snacks per day.  Aim for no more than 5 hours between eating.  If you eat breakfast, please do so within one hour of getting up.    Each meal should contain half fruits/vegetables, one quarter protein, and one quarter carbs (no bigger than a computer mouse)   Cut down on sweet beverages. This includes juice, soda, and sweet tea.     Drink at least 1 glass of water with each meal and aim for at least 8 glasses per day   Exercise at least 150 minutes every week.    Preventive Care 47-22 Years Old, Female Preventive care refers to lifestyle choices and visits with your health care provider that can promote health and wellness. This includes:  A yearly physical exam. This is also called an annual wellness visit.  Regular dental and eye exams.  Immunizations.  Screening for certain conditions.  Healthy lifestyle choices, such as: ? Eating a healthy diet. ? Getting regular exercise. ? Not using drugs or products that contain nicotine and tobacco. ? Limiting alcohol use. What can I expect for my preventive care visit? Physical exam Your health care provider may check your:  Height and weight. These may be used to calculate your BMI (body mass index). BMI is a measurement that tells if you are at a healthy weight.  Heart rate and  blood pressure.  Body temperature.  Skin for abnormal spots. Counseling Your health care provider may ask you questions about your:  Past medical problems.  Family's medical history.  Alcohol, tobacco, and drug use.  Emotional well-being.  Home life and relationship well-being.  Sexual activity.  Diet, exercise, and sleep habits.  Work and work Statistician.  Access to firearms.  Method of birth control.  Menstrual cycle.  Pregnancy history. What immunizations do I need? Vaccines are usually given at various ages, according to a schedule. Your health care provider will recommend vaccines for you based on your age, medical history, and lifestyle or other factors, such as travel or where you work.   What tests do I need? Blood tests  Lipid and cholesterol levels. These may be checked every 5 years starting at age 56.  Hepatitis C test.  Hepatitis B test. Screening  Diabetes screening. This is done by checking your blood sugar (glucose) after you have not eaten for a while (fasting).  STD (sexually transmitted disease) testing, if you are at risk.  BRCA-related cancer screening. This may be done if you have a family history of breast, ovarian, tubal, or peritoneal cancers.  Pelvic exam and Pap test. This may be done every 3 years starting at age 58. Starting at age 69, this 22  be done every 5 years if you have a Pap test in combination with an HPV test. Talk with your health care provider about your test results, treatment options, and if necessary, the need for more tests.   Follow these instructions at home: Eating and drinking  Eat a healthy diet that includes fresh fruits and vegetables, whole grains, lean protein, and low-fat dairy products.  Take vitamin and mineral supplements as recommended by your health care provider.  Do not drink alcohol if: ? Your health care provider tells you not to drink. ? You are pregnant, may be pregnant, or are planning to  become pregnant.  If you drink alcohol: ? Limit how much you have to 0-1 drink a day. ? Be aware of how much alcohol is in your drink. In the [place] one drink equals one 12 oz bottle of beer (355 mL), one 5 oz glass of wine (148 mL), or one 1 oz glass of hard liquor (44 mL).   Lifestyle  Take daily care of your teeth and gums. Brush your teeth every morning and night with fluoride toothpaste. Floss one time each day.  Stay active. Exercise for at least 30 minutes 5 or more days each week.  Do not use any products that contain nicotine or tobacco, such as cigarettes, e-cigarettes, and chewing tobacco. If you need help quitting, ask your health care provider.  Do not use drugs.  If you are sexually active, practice safe sex. Use a condom or other form of protection to prevent STIs (sexually transmitted infections).  If you do not wish to become pregnant, use a form of birth control. If you plan to become pregnant, see your health care provider for a prepregnancy visit.  Find healthy ways to cope with stress, such as: ? Meditation, yoga, or listening to music. ? Journaling. ? Talking to a trusted person. ? Spending time with friends and family. Safety  Always wear your seat belt while driving or riding in a vehicle.  Do not drive: ? If you have been drinking alcohol. Do not ride with someone who has been drinking. ? When you are tired or distracted. ? While texting.  Wear a helmet and other protective equipment during sports activities.  If you have firearms in your house, make sure you follow all gun safety procedures.  Seek help if you have been physically or sexually abused. What's next?  Go to your health care provider once a year for an annual wellness visit.  Ask your health care provider how often you should have your eyes and teeth checked.  Stay up to date on all vaccines. This information is not intended to replace advice given to you by your health care provider.  Make sure you discuss any questions you have with your health care provider. Document Revised: 01/15/2020 Document Reviewed: 01/28/2018 Elsevier Patient Education  2021 [employer].

## 2020-10-22 NOTE — Progress Notes (Signed)
Chief Complaint:  Janice Matthews is a 35 y.o. female who presents today for her annual comprehensive physical exam.    Assessment/Plan:  Chronic Problems Addressed Today: Gestational hypertension Blood pressure now at goal postpartum.  We will continue to monitor.  She is working on diet and exercise and is doing well with both of these things.  We will check labs today.  Vegetarian Check B12 and vitamin D.  Preventative Healthcare: Up-to-date on vaccines and screenings.  We will check labs today.  Patient Counseling(The following topics were reviewed and/or handout was given):  -Nutrition: Stressed importance of moderation in sodium/caffeine intake, saturated fat and cholesterol, caloric balance, sufficient intake of fresh fruits, vegetables, and fiber.  -Stressed the importance of regular exercise.   -Substance Abuse: Discussed cessation/primary prevention of tobacco, alcohol, or other drug use; driving or other dangerous activities under the influence; availability of treatment for abuse.   -Injury prevention: Discussed safety belts, safety helmets, smoke detector, smoking near bedding or upholstery.   -Sexuality: Discussed sexually transmitted diseases, partner selection, use of condoms, avoidance of unintended pregnancy and contraceptive alternatives.   -Dental health: Discussed importance of regular tooth brushing, flossing, and dental visits.  -Health maintenance and immunizations reviewed. Please refer to Health maintenance section.  Return to care in 1 year for next preventative visit.     Subjective:  HPI:  She has no acute complaints today.   Lifestyle Diet: Plenty of fruits and vegetables.  Exercise: Gets plenty of cardio.   Depression screen PHQ 2/9 10/22/2020  Decreased Interest 0  Down, Depressed, Hopeless 0  PHQ - 2 Score 0    Health Maintenance Due  Topic Date Due  . Hepatitis C Screening  Never done     ROS: Per HPI, otherwise a complete review of  systems was negative.   PMH:  The following were reviewed and entered/updated in epic: Past Medical History:  Diagnosis Date  . Hypertension    There are no problems to display for this patient.  No past surgical history on file.  Family History  Problem Relation Age of Onset  . Diabetes Mother   . Thyroid disease Mother     Medications- reviewed and updated No current outpatient medications on file.   No current facility-administered medications for this visit.    Allergies-reviewed and updated No Known Allergies  Social History   Socioeconomic History  . Marital status: Married    Spouse name: Not on file  . Number of children: 2  . Years of education: Not on file  . Highest education level: Bachelor's degree (e.g., BA, AB, BS)  Occupational History  . Not on file  Tobacco Use  . Smoking status: Never Smoker  . Smokeless tobacco: Never Used  Vaping Use  . Vaping Use: Never used  Substance and Sexual Activity  . Alcohol use: Not Currently    Comment: occ  . Drug use: Never  . Sexual activity: Yes    Birth control/protection: None  Other Topics Concern  . Not on file  Social History Narrative  . Not on file   Social Determinants of Health   Financial Resource Strain: Not on file  Food Insecurity: Not on file  Transportation Needs: Not on file  Physical Activity: Not on file  Stress: Not on file  Social Connections: Not on file        Objective:  Physical Exam: BP 114/76   Pulse 82   Temp (!) 97.5 F (36.4 C) (Temporal)  Ht 5\' 5"  (1.651 m)   Wt 149 lb 3.2 oz (67.7 kg)   SpO2 97%   BMI 24.83 kg/m   Body mass index is 24.83 kg/m. Wt Readings from Last 3 Encounters:  10/22/20 149 lb 3.2 oz (67.7 kg)  02/23/20 179 lb 1.6 oz (81.2 kg)  02/23/20 179 lb (81.2 kg)   Gen: NAD, resting comfortably HEENT: TMs normal bilaterally. OP clear. No thyromegaly noted.  CV: RRR with no murmurs appreciated Pulm: NWOB, CTAB with no crackles, wheezes, or  rhonchi GI: Normal bowel sounds present. Soft, Nontender, Nondistended. MSK: no edema, cyanosis, or clubbing noted Skin: warm, dry Neuro: CN2-12 grossly intact. Strength 5/5 in upper and lower extremities. Reflexes symmetric and intact bilaterally.  Psych: Normal affect and thought content     Edson Deridder M. 02/25/20, MD 10/22/2020 9:53 AM

## 2020-10-24 NOTE — Progress Notes (Signed)
Please inform patient of the following:  Vitamin D is a bit low but everything else is normal.  She should be taking 1000 2000 IUs of vitamin D daily.  We can recheck in 6 to 12 months.

## 2021-03-12 ENCOUNTER — Other Ambulatory Visit (HOSPITAL_COMMUNITY): Payer: Self-pay

## 2021-03-23 ENCOUNTER — Other Ambulatory Visit (HOSPITAL_COMMUNITY): Payer: Self-pay

## 2021-03-23 MED ORDER — ONDANSETRON HCL 4 MG PO TABS
ORAL_TABLET | ORAL | 0 refills | Status: DC
  Filled 2021-03-23: qty 30, 5d supply, fill #0

## 2021-07-15 ENCOUNTER — Encounter: Payer: Self-pay | Admitting: Family Medicine

## 2021-07-16 ENCOUNTER — Other Ambulatory Visit: Payer: Self-pay

## 2021-07-16 ENCOUNTER — Ambulatory Visit (INDEPENDENT_AMBULATORY_CARE_PROVIDER_SITE_OTHER): Payer: No Typology Code available for payment source

## 2021-07-16 ENCOUNTER — Ambulatory Visit (INDEPENDENT_AMBULATORY_CARE_PROVIDER_SITE_OTHER): Payer: No Typology Code available for payment source | Admitting: Family Medicine

## 2021-07-16 ENCOUNTER — Encounter: Payer: Self-pay | Admitting: Family Medicine

## 2021-07-16 VITALS — BP 119/80 | HR 70 | Temp 97.9°F | Ht 65.0 in | Wt 147.6 lb

## 2021-07-16 DIAGNOSIS — M79671 Pain in right foot: Secondary | ICD-10-CM

## 2021-07-16 NOTE — Patient Instructions (Signed)
It was very nice to see you today!  We will get an x-ray to make sure nothing is broken.  Please use the postop shoe as much as you can.  It will probably take several weeks for your pain to improve.  Can continue using over-the-counter meds.  You can continue to use ice.  Let me know if your symptoms are not improving over the next several weeks.  Take care, Dr Jimmey Ralph  PLEASE NOTE:  If you had any lab tests please let us know if you have not heard back within a few days. You may see your results on mychart before we have a chance to review them but we will give you a call once they are reviewed by [place]. If we ordered any referrals today, please let us know if you have not heard from their office within the next week.   Please try these tips to maintain a healthy lifestyle:  Eat at least 3 REAL meals and 1-2 snacks per day.  Aim for no more than 5 hours between eating.  If you eat breakfast, please do so within one hour of getting up.   Each meal should contain half fruits/vegetables, one quarter protein, and one quarter carbs (no bigger than a computer mouse)  Cut down on sweet beverages. This includes juice, soda, and sweet tea.   Drink at least 1 glass of water with each meal and aim for at least 8 glasses per day  Exercise at least 150 minutes every week.

## 2021-07-16 NOTE — Progress Notes (Signed)
° °  Janice Matthews is a 36 y.o. female who presents today for an office visit.  Assessment/Plan:  Toe Pain Concern for fracture.  Placed in postop shoe today.  She can continue over-the-counter meds and ice.  Depending on results of x-ray may need referral to orthopedics.    Subjective:  HPI:  Patient here with right great toe pain. This has been present for the past 2 days. She notes she attempted to take the ottoman out of the way and stubbed her big toe. Pain has been present since then. She notes pain is present on the top of her feet and big toe. She has little bit of swelling. Some bruising. She has tried ibuprofen with some improvement. Pain has been improving. No purulent discharge. No other treatment tried. No pain in any other area.       Objective:  Physical Exam: BP 119/80 (BP Location: Right Arm)    Pulse 70    Temp 97.9 F (36.6 C) (Temporal)    Ht 5\' 5"  (1.651 m)    Wt 147 lb 9.6 oz (67 kg)    SpO2 99%    BMI 24.56 kg/m   Gen: No acute distress, resting comfortably MSK:  - Right Foot: Swelling and ecchymosis to right great toe.  Neurovascular intact distally.  Tenderness with palpation and axial loading. Neuro: Grossly normal, moves all extremities Psych: Normal affect and thought content       I,Janice Matthews,acting as a scribe for , MD.,have documented all relevant documentation on the behalf of Janice Doe, MD,as directed by  Janice Doe, MD while in the presence of Janice Doe, MD.   I, Janice Doe, MD, have reviewed all documentation for this visit. The documentation on 07/16/21 for the exam, diagnosis, procedures, and orders are all accurate and complete.  07/18/21. Janice Degree, MD 07/16/2021 1:25 PM

## 2021-07-17 ENCOUNTER — Other Ambulatory Visit: Payer: Self-pay

## 2021-07-17 ENCOUNTER — Ambulatory Visit (INDEPENDENT_AMBULATORY_CARE_PROVIDER_SITE_OTHER): Payer: No Typology Code available for payment source | Admitting: Family

## 2021-07-17 ENCOUNTER — Encounter: Payer: Self-pay | Admitting: Family

## 2021-07-17 DIAGNOSIS — S92414A Nondisplaced fracture of proximal phalanx of right great toe, initial encounter for closed fracture: Secondary | ICD-10-CM | POA: Diagnosis not present

## 2021-07-17 DIAGNOSIS — S92401A Displaced unspecified fracture of right great toe, initial encounter for closed fracture: Secondary | ICD-10-CM

## 2021-07-17 NOTE — Progress Notes (Signed)
Office Visit Note   Patient: Janice Matthews           Date of Birth: 09-30-1985           MRN: [id number] Visit Date: 07/17/2021              Requested by: Ardith Dark, MD [address] [place],  [place] [address] PCP: Ardith Dark, MD  Chief Complaint  Patient presents with   Right Foot - Fracture    Right great toe fracture      HPI: The patient is a 36 year old woman who presents today for evaluation of right great toe pain.  Yesterday she hit her foot on her ottoman and had immediate onset of swelling and pain she has had pain with flexion of the great toe.  She did have radiographs yesterday which showed fracture of the proximal phalanx great toe.  Assessment & Plan: Visit Diagnoses:  1. Closed nondisplaced fracture of proximal phalanx of right great toe, initial encounter     Plan: She will continue her postop shoe for the next 2 weeks.  At that point she can advance to stiff walking shoes.  Discussed that she may follow-up in 2 weeks for repeat radiographs  Follow-Up Instructions: No follow-ups on file.   Ortho Exam  Patient is alert, oriented, no adenopathy, well-dressed, normal affect, normal respiratory effort. On examination of the right foot mild edema with ecchymosis over the great toe medial column.  She is neurovascularly intact distally  Imaging: DG Foot 2 Views Right  Result Date: 07/17/2021 CLINICAL DATA:  Right medial foot/great toe pain x 2 days. EXAM: RIGHT FOOT - 2 VIEW COMPARISON:  None. FINDINGS: Vague intra-articular lucency along the head/neck of the first digit proximal phalanx. Otherwise no acute displaced fracture or dislocation. There is no evidence of arthropathy or other focal bone abnormality. Soft tissues are unremarkable. IMPRESSION: Vague intra-articular lucency along the head/neck of the first digit proximal phalanx. Correlate with point tenderness to palpation to evaluate for an acute nondisplaced fracture. Electronically Signed   By:  Tish Frederickson M.D.   On: 07/17/2021 01:58   No images are attached to the encounter.  Labs: Lab Results  Component Value Date   HGBA1C 5.2 08/09/2019     Lab Results  Component Value Date   ALBUMIN 4.5 10/22/2020   ALBUMIN 2.6 (L) 02/23/2020   ALBUMIN 3.5 (L) 02/10/2020    No results found for: MG Lab Results  Component Value Date   VD25OH 26.73 (L) 10/22/2020    No results found for: PREALBUMIN CBC EXTENDED Latest Ref Rng & Units 10/22/2020 02/23/2020 02/23/2020  WBC 4.0 - 10.5 K/uL 8.4 12.2(H) 11.1(H)  RBC 3.87 - 5.11 Mil/uL 4.83 4.24 4.27  HGB 12.0 - 15.0 g/dL 16.9 67.8 93.8  HCT 10.1 - 46.0 % 42.0 37.8 38.3  PLT 150.0 - 400.0 K/uL 354.0 232 267  NEUTROABS 1.4 - 7.0 x10E3/uL - - -  LYMPHSABS 0.7 - 3.1 x10E3/uL - - -     There is no height or weight on file to calculate BMI.  Orders:  No orders of the defined types were placed in this encounter.  No orders of the defined types were placed in this encounter.    Procedures: No procedures performed  Clinical Data: No additional findings.  ROS:  All other systems negative, except as noted in the HPI. Review of Systems  Objective: Vital Signs: LMP 07/14/2021   Specialty Comments:  No specialty comments available.  PMFS History: There are no problems to display for this patient.  Past Medical History:  Diagnosis Date   Hypertension     Family History  Problem Relation Age of Onset   Diabetes Mother    Thyroid disease Mother     History reviewed. No pertinent surgical history. Social History   Occupational History   Not on file  Tobacco Use   Smoking status: Never   Smokeless tobacco: Never  Vaping Use   Vaping Use: Never used  Substance and Sexual Activity   Alcohol use: Not Currently    Comment: occ   Drug use: Never   Sexual activity: Yes    Birth control/protection: None

## 2021-07-17 NOTE — Progress Notes (Signed)
Please inform patient of the following:  Her xray is not conclusive but it looks like she probably has a fracture. This probably should heal without issue over the next 4-6 weeks but it looks like it may be in the joint and we should have an orthopedist take a look and follow up to make sure it is healing properly. Please place referral to orthopedics.   She should keep toe immobilized as much as possible over the next several weeks until she no longer has pain.  Janice Matthews. Jimmey Ralph, MD 07/17/2021 7:54 AM

## 2021-07-31 ENCOUNTER — Ambulatory Visit: Payer: No Typology Code available for payment source | Admitting: Family

## 2021-10-23 ENCOUNTER — Ambulatory Visit (INDEPENDENT_AMBULATORY_CARE_PROVIDER_SITE_OTHER): Payer: No Typology Code available for payment source | Admitting: Family Medicine

## 2021-10-23 ENCOUNTER — Encounter: Payer: Self-pay | Admitting: Family Medicine

## 2021-10-23 VITALS — BP 110/78 | HR 84 | Temp 97.8°F | Ht 65.0 in | Wt 144.0 lb

## 2021-10-23 DIAGNOSIS — Z1159 Encounter for screening for other viral diseases: Secondary | ICD-10-CM

## 2021-10-23 DIAGNOSIS — E559 Vitamin D deficiency, unspecified: Secondary | ICD-10-CM | POA: Diagnosis not present

## 2021-10-23 DIAGNOSIS — Z0001 Encounter for general adult medical examination with abnormal findings: Secondary | ICD-10-CM

## 2021-10-23 DIAGNOSIS — E781 Pure hyperglyceridemia: Secondary | ICD-10-CM

## 2021-10-23 LAB — CBC
HCT: 43 % (ref 36.0–46.0)
Hemoglobin: 14.6 g/dL (ref 12.0–15.0)
MCHC: 33.9 g/dL (ref 30.0–36.0)
MCV: 86.7 fl (ref 78.0–100.0)
Platelets: 257 10*3/uL (ref 150.0–400.0)
RBC: 4.96 Mil/uL (ref 3.87–5.11)
RDW: 13.3 % (ref 11.5–15.5)
WBC: 4.8 10*3/uL (ref 4.0–10.5)

## 2021-10-23 LAB — COMPREHENSIVE METABOLIC PANEL
ALT: 17 U/L (ref 0–35)
AST: 22 U/L (ref 0–37)
Albumin: 4.7 g/dL (ref 3.5–5.2)
Alkaline Phosphatase: 100 U/L (ref 39–117)
BUN: 13 mg/dL (ref 6–23)
CO2: 30 mEq/L (ref 19–32)
Calcium: 9.8 mg/dL (ref 8.4–10.5)
Chloride: 103 mEq/L (ref 96–112)
Creatinine, Ser: 0.99 mg/dL (ref 0.40–1.20)
GFR: 73.45 mL/min (ref >60.00)
Glucose, Bld: 83 mg/dL (ref 70–99)
Potassium: 4.3 mEq/L (ref 3.5–5.1)
Sodium: 142 mEq/L (ref 135–145)
Total Bilirubin: 0.5 mg/dL (ref 0.2–1.2)
Total Protein: 6.9 g/dL (ref 6.0–8.3)

## 2021-10-23 LAB — LIPID PANEL
Cholesterol: 154 mg/dL (ref 0–200)
HDL: 77.2 mg/dL (ref >39.00)
LDL Cholesterol: 68 mg/dL (ref 0–99)
NonHDL: 76.88
Total CHOL/HDL Ratio: 2
Triglycerides: 46 mg/dL (ref 0.0–149.0)
VLDL: 9.2 mg/dL (ref 0.0–40.0)

## 2021-10-23 LAB — VITAMIN D 25 HYDROXY (VIT D DEFICIENCY, FRACTURES): VITD: 30.76 ng/mL (ref 30.00–100.00)

## 2021-10-23 LAB — TSH: TSH: 1.72 u[IU]/mL (ref 0.35–5.50)

## 2021-10-23 NOTE — Progress Notes (Signed)
Chief Complaint:  Janice Matthews is a 36 y.o. female who presents today for her annual comprehensive physical exam.    Assessment/Plan:  Chronic Problems Addressed Today: Vitamin D deficiency Check Vit D.   Preventative Healthcare: Check labs.  Up-to-date on vaccines and screenings.  Patient Counseling(The following topics were reviewed and/or handout was given):  -Nutrition: Stressed importance of moderation in sodium/caffeine intake, saturated fat and cholesterol, caloric balance, sufficient intake of fresh fruits, vegetables, and fiber.  -Stressed the importance of regular exercise.   -Substance Abuse: Discussed cessation/primary prevention of tobacco, alcohol, or other drug use; driving or other dangerous activities under the influence; availability of treatment for abuse.   -Injury prevention: Discussed safety belts, safety helmets, smoke detector, smoking near bedding or upholstery.   -Sexuality: Discussed sexually transmitted diseases, partner selection, use of condoms, avoidance of unintended pregnancy and contraceptive alternatives.   -Dental health: Discussed importance of regular tooth brushing, flossing, and dental visits.  -Health maintenance and immunizations reviewed. Please refer to Health maintenance section.  Return to care in 1 year for next preventative visit.     Subjective:  HPI:  She has no acute complaints today.   Lifestyle Diet: Balanced.  Exercise: Limited due to prepping for a move though she will be getting back into exercising more once her move is finalized.      10/23/2021    8:52 AM  Depression screen PHQ 2/9  Decreased Interest 0  Down, Depressed, Hopeless 0  PHQ - 2 Score 0    Health Maintenance Due  Topic Date Due   Hepatitis C Screening  Never done     ROS: Per HPI, otherwise a complete review of systems was negative.   PMH:  The following were reviewed and entered/updated in epic: Past Medical History:  Diagnosis Date    Hypertension    Patient Active Problem List   Diagnosis Date Noted   Vitamin D deficiency 10/23/2021   History reviewed. No pertinent surgical history.  Family History  Problem Relation Age of Onset   Diabetes Mother    Thyroid disease Mother     Medications- reviewed and updated No current outpatient medications on file.   No current facility-administered medications for this visit.    Allergies-reviewed and updated No Known Allergies  Social History   Socioeconomic History   Marital status: Married    Spouse name: Not on file   Number of children: 2   Years of education: Not on file   Highest education level: Bachelor's degree (e.g., BA, AB, BS)  Occupational History   Not on file  Tobacco Use   Smoking status: Never   Smokeless tobacco: Never  Vaping Use   Vaping Use: Never used  Substance and Sexual Activity   Alcohol use: Not Currently    Comment: occ   Drug use: Never   Sexual activity: Yes    Birth control/protection: None  Other Topics Concern   Not on file  Social History Narrative   Not on file   Social Determinants of Health   Financial Resource Strain: Not on file  Food Insecurity: Not on file  Transportation Needs: Not on file  Physical Activity: Not on file  Stress: Not on file  Social Connections: Not on file        Objective:  Physical Exam: BP 110/78   Pulse 84   Temp 97.8 F (36.6 C) (Temporal)   Ht 5\' 5"  (1.651 m)   Wt 144 lb (65.3 kg)  LMP 10/04/2021   SpO2 99%   BMI 23.96 kg/m   Body mass index is 23.96 kg/m. Wt Readings from Last 3 Encounters:  10/23/21 144 lb (65.3 kg)  07/16/21 147 lb 9.6 oz (67 kg)  10/22/20 149 lb 3.2 oz (67.7 kg)   Gen: NAD, resting comfortably HEENT: TMs normal bilaterally. OP clear. No thyromegaly noted.  CV: RRR with no murmurs appreciated Pulm: NWOB, CTAB with no crackles, wheezes, or rhonchi GI: Normal bowel sounds present. Soft, Nontender, Nondistended. MSK: no edema, cyanosis, or  clubbing noted Skin: warm, dry Neuro: CN2-12 grossly intact. Strength 5/5 in upper and lower extremities. Reflexes symmetric and intact bilaterally.  Psych: Normal affect and thought content     Janice Matthews M. Jimmey Ralph, MD 10/23/2021 9:17 AM

## 2021-10-23 NOTE — Patient Instructions (Signed)
It was very nice to see you today!  We will check blood work today.  Please continue to work on diet and exercise.  We will see back in 1 year for your next physical.  Come back sooner if needed.  Take care, Dr Jerline Pain  PLEASE NOTE:  If you had any lab tests please let us know if you have not heard back within a few days. You may see your results on mychart before we have a chance to review them but we will give you a call once they are reviewed by [place]. If we ordered any referrals today, please let us know if you have not heard from their office within the next week.   Please try these tips to maintain a healthy lifestyle:  Eat at least 3 REAL meals and 1-2 snacks per day.  Aim for no more than 5 hours between eating.  If you eat breakfast, please do so within one hour of getting up.   Each meal should contain half fruits/vegetables, one quarter protein, and one quarter carbs (no bigger than a computer mouse)  Cut down on sweet beverages. This includes juice, soda, and sweet tea.   Drink at least 1 glass of water with each meal and aim for at least 8 glasses per day  Exercise at least 150 minutes every week.    Preventive Care 66-21 Years Old, Female Preventive care refers to lifestyle choices and visits with your health care provider that can promote health and wellness. Preventive care visits are also called wellness exams. What can I expect for my preventive care visit? Counseling During your preventive care visit, your health care provider may ask about your: Medical history, including: Past medical problems. Family medical history. Pregnancy history. Current health, including: Menstrual cycle. Method of birth control. Emotional well-being. Home life and relationship well-being. Sexual activity and sexual health. Lifestyle, including: Alcohol, nicotine or tobacco, and drug use. Access to firearms. Diet, exercise, and sleep habits. Work and work Statistician. Sunscreen  use. Safety issues such as seatbelt and bike helmet use. Physical exam Your health care provider may check your: Height and weight. These may be used to calculate your BMI (body mass index). BMI is a measurement that tells if you are at a healthy weight. Waist circumference. This measures the distance around your waistline. This measurement also tells if you are at a healthy weight and may help predict your risk of certain diseases, such as type 2 diabetes and high blood pressure. Heart rate and blood pressure. Body temperature. Skin for abnormal spots. What immunizations do I need?  Vaccines are usually given at various ages, according to a schedule. Your health care provider will recommend vaccines for you based on your age, medical history, and lifestyle or other factors, such as travel or where you work. What tests do I need? Screening Your health care provider may recommend screening tests for certain conditions. This may include: Pelvic exam and Pap test. Lipid and cholesterol levels. Diabetes screening. This is done by checking your blood sugar (glucose) after you have not eaten for a while (fasting). Hepatitis B test. Hepatitis C test. HIV (human immunodeficiency virus) test. STI (sexually transmitted infection) testing, if you are at risk. BRCA-related cancer screening. This may be done if you have a family history of breast, ovarian, tubal, or peritoneal cancers. Talk with your health care provider about your test results, treatment options, and if necessary, the need for more tests. Follow these instructions at home: Eating  and drinking  Eat a healthy diet that includes fresh fruits and vegetables, whole grains, lean protein, and low-fat dairy products. Take vitamin and mineral supplements as recommended by your health care provider. Do not drink alcohol if: Your health care provider tells you not to drink. You are pregnant, may be pregnant, or are planning to become  pregnant. If you drink alcohol: Limit how much you have to 0-1 drink a day. Know how much alcohol is in your drink. In the [place] one drink equals one 12 oz bottle of beer (355 mL), one 5 oz glass of wine (148 mL), or one 1 oz glass of hard liquor (44 mL). Lifestyle Brush your teeth every morning and night with fluoride toothpaste. Floss one time each day. Exercise for at least 30 minutes 5 or more days each week. Do not use any products that contain nicotine or tobacco. These products include cigarettes, chewing tobacco, and vaping devices, such as e-cigarettes. If you need help quitting, ask your health care provider. Do not use drugs. If you are sexually active, practice safe sex. Use a condom or other form of protection to prevent STIs. If you do not wish to become pregnant, use a form of birth control. If you plan to become pregnant, see your health care provider for a prepregnancy visit. Find healthy ways to manage stress, such as: Meditation, yoga, or listening to music. Journaling. Talking to a trusted person. Spending time with friends and family. Minimize exposure to UV radiation to reduce your risk of skin cancer. Safety Always wear your seat belt while driving or riding in a vehicle. Do not drive: If you have been drinking alcohol. Do not ride with someone who has been drinking. If you have been using any mind-altering substances or drugs. While texting. When you are tired or distracted. Wear a helmet and other protective equipment during sports activities. If you have firearms in your house, make sure you follow all gun safety procedures. Seek help if you have been physically or sexually abused. What's next? Go to your health care provider once a year for an annual wellness visit. Ask your health care provider how often you should have your eyes and teeth checked. Stay up to date on all vaccines. This information is not intended to replace advice given to you by your  health care provider. Make sure you discuss any questions you have with your health care provider. Document Revised: 11/14/2020 Document Reviewed: 11/14/2020 Elsevier Patient Education  [place].

## 2021-10-23 NOTE — Assessment & Plan Note (Signed)
Check Vit D 

## 2021-10-24 LAB — HEPATITIS C ANTIBODY
Hepatitis C Ab: NONREACTIVE
SIGNAL TO CUT-OFF: 0.07 (ref <1.00)

## 2021-10-25 NOTE — Progress Notes (Signed)
Please inform patient of the following:  Good news! Labs are all NORMAL. We can recheck again in a few years.  Janice Matthews. Jimmey Ralph, MD 10/25/2021 1:46 PM

## 2022-02-24 ENCOUNTER — Encounter: Payer: Self-pay | Admitting: *Deleted

## 2022-03-10 ENCOUNTER — Encounter: Payer: Self-pay | Admitting: Family Medicine

## 2022-03-11 ENCOUNTER — Encounter: Payer: Self-pay | Admitting: Physician Assistant

## 2022-03-11 ENCOUNTER — Other Ambulatory Visit (HOSPITAL_COMMUNITY): Payer: Self-pay

## 2022-03-11 ENCOUNTER — Telehealth (INDEPENDENT_AMBULATORY_CARE_PROVIDER_SITE_OTHER): Payer: No Typology Code available for payment source | Admitting: Physician Assistant

## 2022-03-11 VITALS — Ht 65.0 in | Wt 144.0 lb

## 2022-03-11 DIAGNOSIS — F419 Anxiety disorder, unspecified: Secondary | ICD-10-CM

## 2022-03-11 MED ORDER — HYDROXYZINE HCL 10 MG PO TABS
10.0000 mg | ORAL_TABLET | Freq: Three times a day (TID) | ORAL | 0 refills | Status: AC | PRN
  Filled 2022-03-11: qty 20, 7d supply, fill #0

## 2022-03-11 NOTE — Progress Notes (Signed)
   Virtual Visit via Video Note  I connected with  Janice Matthews  on 03/11/22 at 11:00 AM EDT by a video enabled telemedicine application and verified that I am speaking with the correct person using two identifiers.  Location: Patient: home Provider: Therapist, music at [place] present: Patient and myself   I discussed the limitations of evaluation and management by telemedicine and the availability of in person appointments. The patient expressed understanding and agreed to proceed.   History of Present Illness:  36 yo relatively healthy female presenting for virtual visit today to discuss worsening acute anxiety. New localized small bedbug issue in one room of home, professionals are dealing with this issue, but she is suffering terrible anxiety from this. Trouble with eating, spiraling thoughts, sleeping fairly well though. Hyper-fixated on the issue. Feels like she may need something to help with acute anxiety; considering long-term counseling in future as well.    Observations/Objective:   Gen: Awake, alert, no acute distress Resp: Breathing is even and non-labored Psych: Anxiousness present Neuro: Alert and Oriented x 3, + facial symmetry, speech is clear.   Assessment and Plan:  Acute anxiety -Discussed with PCP Dr. Jerline Pain - will start on hydroxyzine 10 mg up to TID prn anxiety, cautioned side effects - she will trial this in the next few days; if no improvement of sx's or cannot tolerate medication, may need to consider other options such as a benzodiazepine short-term. Pt to reach back out via MyChart to Dr. Jerline Pain this week with update.  Follow Up Instructions:    I discussed the assessment and treatment plan with the patient. The patient was provided an opportunity to ask questions and all were answered. The patient agreed with the plan and demonstrated an understanding of the instructions.   The patient was advised to call back or seek an  in-person evaluation if the symptoms worsen or if the condition fails to improve as anticipated.  Vidal Lampkins M Marleen Moret, PA-C

## 2022-03-11 NOTE — Patient Instructions (Signed)
Trial hydroxyzine as directed - send update in next few days See AVS to help with managing anxiety

## 2022-03-18 ENCOUNTER — Ambulatory Visit (INDEPENDENT_AMBULATORY_CARE_PROVIDER_SITE_OTHER): Payer: No Typology Code available for payment source | Admitting: Psychology

## 2022-03-18 ENCOUNTER — Encounter: Payer: Self-pay | Admitting: Family Medicine

## 2022-03-18 DIAGNOSIS — F419 Anxiety disorder, unspecified: Secondary | ICD-10-CM

## 2022-03-18 NOTE — Progress Notes (Signed)
? ? ? ? ? ? ? ? ? ? ? ? ? ? ?  Janice Matthews, LCMHC ?

## 2022-03-18 NOTE — Progress Notes (Signed)
[facility] Counselor Initial Adult Exam  Name: Janice Matthews Date: 03/18/2022 MRN: [id number] DOB: [birth year] PCP: Vivi Barrack, MD  Time spent: 1:27pm-2:28pm  Pt is seen for a virtual video visit via caregility.  Pt joins from her home and counselor from her home office.    Guardian/Payee:  self     Paperwork requested: No   Reason for Visit /Presenting Problem: Pt is referred by her PCP for anxiety.  Pt states "I have wanted to do therapy for awhile now but has been hard to find the time.  Pt reported in past couple of weeks has felt more in crisis w/ anxiety given recent stressor at home and w/ the recent war in [place].  Pt reported that she thought her house had bed bugs after finding 2 dead bed bugs in the home.  Pt reports the dead bed bugs were positively identified by a expert.  An exterminator recently came and there is no sign of any bed bugs.  Pt acknowledges she is not convinced and still is ruminating on potential of bed bugs in the home and checking for.  Pt reports 2 weeks ago she wasn't coping well- couldn't eat, lost a bunch a weight in a week, and didn't feel she could be presen to her kids.  Pt reports she did see a NP and was started on Vistaril as needed.  Pt reports over past week w/ terrorist attack in [place] she has felt very isolated and visceral response.  Pt also has worry for exposure to bed bugs w/ upcoming travel and plans to stay in [place] and w/ in laws plans to visit in a couple.  Other stressors in past year include- Moving into first home June 2023 and feeling overwhelmed w/ getting house unpacked and set up.  Pt also recognizes increased anxiety and irrational thoughts over past 4-5 months right before her period..  Pt reports would like to consider medication that would target anxiety long term and not just as PRN.  Pt also endorses worry for her parents health-mom diabetes and macular degeneration and worry for other driver's causing an  accident.  Pt reports stressors of not feeling present enough to her youngest and no down time for self.    Mental Status Exam: Appearance:   Well Groomed     Behavior:  Appropriate  Motor:  Normal  Speech/Language:   Normal Rate  Affect:  Appropriate  Mood:  anxious  Thought process:  normal  Thought content:    WNL  Sensory/Perceptual disturbances:    WNL  Orientation:  oriented to person, place, time/date, and situation  Attention:  Good  Concentration:  Good  Memory:  WNL  Fund of knowledge:   Good  Insight:    Good  Judgment:   Good  Impulse Control:  Good   Reported Symptoms:   "I'm in a better state then last week. But I still feel very anxious." Pt scored a 12 on GAD 7 and 8 on PHQ9.  Pt denies hallucinations.  Pt reports hx of post partum anxiety re: climate change worries following the birth of her daughter.  Pt is able to acknowledge she isn't thinking rationally about current bed bug situation.    Risk Assessment: Danger to Self:  No Self-injurious Behavior: No Danger to Others: No Duty to Warn:no Physical Aggression / Violence:No  Access to Firearms a concern: No  Gang Involvement:No  Patient / guardian was educated about steps to take  if suicide or homicide risk level increases between visits: n/a While future psychiatric events cannot be accurately predicted, the patient does not currently require acute inpatient psychiatric care and does not currently meet [facility] involuntary commitment criteria. No truama.  Dad has short fuse- never physical.  People pleaser.  Afraid to contradict.  Sister.    Substance Abuse History: Current substance abuse: No   occasionally alcohol use.  No drug, tobacco use.    Past Psychiatric History:   No previous psychological problems have been observed  pt reports did struggle w/ post partum anxiety after the birth of her daughter- but never sought tx for.  Outpatient Providers: none History of Psych Hospitalization: No   Psychological Testing:  none    Abuse History:  Victim of: No.,  none    Report needed: No. Victim of Neglect:No. Perpetrator of  none   Witness / Exposure to Domestic Violence: No   Protective Services Involvement: No  Witness to [employer] Violence:   none  Family History:  Family History  Problem Relation Age of Onset   Diabetes Mother    Thyroid disease Mother   Pt reports her Dad had couple periods of mental health distress- 2019/2020 and 2009.  Pt reported he saw a psychiatrist and on several medications, dx w/ depression and wasn't himself.  Mom reports mom has recently been prescribed Wellbutrin.    Pt grew up w/ her parents in [place] she is the oldest and has a younger sister who lives in [place], younger brother in [place], and youngest brother who lives w/ parents.  Pt reports she is close w/her sister.   Living situation: the patient lives with her family in [place], [place].  They moved here 3 years ago and bought first home June 2023.  Her in laws live in Parents in [place], [place].  Her parents live in [place]  Sexual Orientation:  not reported  Relationship Status: married for 9 year to her husband.  They have been together for 15 years.  They met in [place] in [place].  Moved to [place] for husband's med school, moved back to [place] for husband's residency and moved to [facility] [place] [facility] [place] for fellowship which finished in 2021. Plans to say in area.  Pt reports connection w/ jewish community. Name of spouse / other: Rodman Key If a parent, number of children / ages: 7y/o son, 5y/o daughter, 2y/o son.  Her son is in   Soccer and her Daughter in dance.  Support Systems: couple of moms through [employer] school.  Temple gardening group- couple people.  Husband's coworker.  Husband.  Financial Stress:  No   Income/Employment/Disability: Employment husband.  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   Original plan to return to work after youngest turned 36y/o.  Pt reports that thought is overwhelming now.    Education: Forensic psychologist in Education.   Religion/Sprituality/World View:  Jewish Pt granddaughter of Holocaust survivors. Her grandmother lived in [place] after [place] and mom born in [place].  Pt kid attend private jewish day school.  Pt reports most of  her friend through that community.      Any cultural differences that may affect / interfere with treatment:  positive Jewish community support for pt.  Recreation/Hobbies: working out/jogging, cooking/baking projects, Firefighter, Product/process development scientist shopping for home. going into nature.  Pt has had loss of interest/motivation/time for things she enjoys.  Stressors: recent concern for bed bugs in home, political climate, world events, [place] war, struggles to feel like time to connect w/ husband.  Husband has anxiety over his own health w/ positive Gene for cancer.    Strengths: Supportive Relationships and religious community and seeking tx  Barriers:  mom of 3- time   Legal History: Pending legal issue / charges: The patient has no significant history of legal issues. History of legal issue / charges:  none  Medical History/Surgical History: reviewed Past Medical History:  Diagnosis Date   Hypertension     No past surgical history on file.  Medications: Current Outpatient Medications  Medication Sig Dispense Refill   hydrOXYzine (ATARAX) 10 MG tablet Take 1 tablet (10 mg total) by mouth 3 (three) times daily as needed for anxiety. 20 tablet 0   No current facility-administered medications for this visit.  Pt reports taking 1 dose some days of the week.    No Known Allergies  Diagnoses:  Anxiety disorder, unspecified type  Plan of Care: Pt is a 36y/o married female who present for counseling as referred by her PCP.  Pt endorses some hx of previous postpartum anxiety never dx or tx.  Pt reports increased anxiety over past  4-5 months since buying first home.  Pt reports anxiety exacerbated in past couple weeks when found a confirmed dead bed bug and began worrying about bed bug infestation.  Pt has had exterminator inform no signs of bed bugs and pt aware irrational that worries.  Pt has started on vistaril and reports some improvements.  Pt aware that other stressors compounded recent worry.  Pt no hx of psychosis, no SA.  Pt will f/u w/ weekly counseling and f/u w/ her PCP for medication management.  Counselor and pt to develop tx plan at next visit.    Jan Fireman, [facility]

## 2022-03-24 ENCOUNTER — Ambulatory Visit (INDEPENDENT_AMBULATORY_CARE_PROVIDER_SITE_OTHER): Payer: No Typology Code available for payment source | Admitting: Psychology

## 2022-03-24 DIAGNOSIS — F419 Anxiety disorder, unspecified: Secondary | ICD-10-CM | POA: Diagnosis not present

## 2022-03-24 NOTE — Progress Notes (Signed)
[facility] Counselor/Therapist Progress Note  Patient ID: Janice Matthews, MRN: [id number],    Date: 03/24/2022  Time Spent: 1:30pm-2:23pm   Pt is seen for virtual video visit via caregility.  Pt joins from her home, reporting privacy and counselor from her home office.    Treatment Type: Individual Therapy  Reported Symptoms: some improvement in anxiety this past week, only one day of intense anxiety/worry  Mental Status Exam: Appearance:  Well Groomed     Behavior: Appropriate  Motor: Normal  Speech/Language:  Normal Rate  Affect: Appropriate  Mood: anxious  Thought process: normal  Thought content:   WNL  Sensory/Perceptual disturbances:   WNL  Orientation: oriented to person, place, time/date, and situation  Attention: Good  Concentration: Good  Memory: WNL  Fund of knowledge:  Good  Insight:   Good  Judgment:  Good  Impulse Control: Good   Risk Assessment: Danger to Self:  No Self-injurious Behavior: No Danger to Others: No Duty to Warn:no Physical Aggression / Violence:No  Access to Firearms a concern: No  Gang Involvement:No   Subjective: Counselor assessed pt current functioning per pt report. Discussed tx goals and developed tx plan w/ pt.  Processed w/ pt anxiety, positives and stressors.  Explored w/pt some improvements in past week and recognizing contributing factors.  Discussed upcoming travel worries, acknowledging worries/anxiety, identifying and reframing distortions.  Discussed self care and current ways pt focuses on relaxing system and being consistent w/ those.  Introduced to mindful breath work, practiced in session, processed and discussed how to utilize for self.  Pt affect wnl.  Pt reported decreased anxiety this past week.   Pt reports she only used prn med one day- yesterday when found dead bed bug in luggage when preparing for trip.  Pt reported that she was able to reframe and not ruminating on infestation.  Pt discussed getting  outside as beneficial this past week. Pt reported that exercise is benefit to her relaxation and self care and trying to be intentional about how to w/ young child at home.  Pt recognized that had withdrawn in past month from friends and did connect in past week.  Pt participated in breat work and reported felt good and relaxing.  Pt commits to practice one a day for self.  Pt discussed anxiety she tends to feel  in anticipate for travel events and that once starts anxiety decreases and reminds self of things looking forward to w/ this travel.    Interventions: Cognitive Behavioral Therapy and Mindfulness Meditation  Diagnosis:Anxiety disorder, unspecified type  Plan: Pt to f/u in 1 week for counseling.  Pt to f/u w/PCP re: medication management.   Individualized Treatment Plan Strengths: seeking counseling for self.  Good insight. working out/jogging, cooking/baking projects, Archivist, Research scientist (physical sciences) shopping for home. going into nature.  Supports: couple of moms through [employer] school.  Temple gardening group- couple people.  Husband's coworker.  Sister and Husband   Goal/Needs for Treatment:  In order of importance to patient 1) increase coping skills to decrease anxiety 2) --- 3) ---   Client Statement of Needs: pt "build a stronger base to cope and ways to cope w/ those bigger moments of stress and anxiety so not totally overwhelmed by things.  Change some patterns of thinking that I get into sometimes.  Cope better w/ daily and bigger stressors to feel more calm."     Treatment Level:outpatient counseling  Symptoms:anxiety, rumination, feeling overwhelmed, irrational worries/thoughts  Client Treatment Preferences:weekly counseling and  medication managment w/ her PCP   Healthcare consumer's goal for treatment:  Counselor, Jan Fireman, [facility] will support the patient's ability to achieve the goals identified. Cognitive Behavioral Therapy, Assertive Communication/Conflict Resolution Training,  Relaxation Training, ACT, Humanistic and other evidenced-based practices will be used to promote progress towards healthy functioning.   Healthcare consumer will: Actively participate in therapy, working towards healthy functioning.    *Justification for Continuation/Discontinuation of Goal: R=Revised, O=Ongoing, A=Achieved, D=Discontinued  Goal 1) Increase use of daily self care and relaxation/mindfulness coping skills to increase feeling calm and cope through stressors AEB pt report and therapist observation. Baseline date 03/24/22: Progress towards goal 0; How Often - Daily Target Date Goal Was reviewed Status Code Progress towards goal/Likert rating  03/25/23                Goal 2) Increase awareness of distortions/ anxious thoughts patterns, challenge and reframe unwanted thoughts to decrease anxiety AEB pt report/therapist observation. Baseline date 03/24/22: Progress towards goal 0; How Often - Daily Target Date Goal Was reviewed Status Code Progress towards goal  03/25/23                  This plan has been reviewed and created by the following participants:  This plan will be reviewed at least every 12 months. Date Behavioral Health Clinician Date Guardian/Patient   03/24/22  [facility] 03/25/23 Verbal Consent Provided                    Jan Fireman [facility]

## 2022-03-24 NOTE — Progress Notes (Signed)
? ? ? ? ? ? ? ? ? ? ? ? ? ? ?  Zuhayr Deeney, LCMHC ?

## 2022-03-31 ENCOUNTER — Ambulatory Visit (INDEPENDENT_AMBULATORY_CARE_PROVIDER_SITE_OTHER): Payer: No Typology Code available for payment source | Admitting: Psychology

## 2022-03-31 DIAGNOSIS — F419 Anxiety disorder, unspecified: Secondary | ICD-10-CM

## 2022-03-31 NOTE — Progress Notes (Signed)
[facility] Counselor/Therapist Progress Note  Patient ID: Janice Matthews, MRN: [id number],    Date: 03/31/2022  Time Spent: 1:30pm-2:16pm   Pt is seen for virtual video visit via caregility.  Pt joins from her home, reporting privacy and counselor from her home office.    Treatment Type: Individual Therapy  Reported Symptoms: some improvement in anxiety, use of grounding breath, decreased rumination and able to enjoy weekend away  Mental Status Exam: Appearance:  Well Groomed     Behavior: Appropriate  Motor: Normal  Speech/Language:  Normal Rate  Affect: Appropriate  Mood: anxious  Thought process: normal  Thought content:   WNL  Sensory/Perceptual disturbances:   WNL  Orientation: oriented to person, place, time/date, and situation  Attention: Good  Concentration: Good  Memory: WNL  Fund of knowledge:  Good  Insight:   Good  Judgment:  Good  Impulse Control: Good   Risk Assessment: Danger to Self:  No Self-injurious Behavior: No Danger to Others: No Duty to Warn:no Physical Aggression / Violence:No  Access to Firearms a concern: No  Gang Involvement:No   Subjective: Counselor assessed pt current functioning per pt report. Processed w/ pt anxiety, positives and stressors.  Explored pt improvements in past week w/ grounding breath and recognizing and reframing worried thoughts.  Reflected goals of managing her stress and anxiety and assisted pt in naming intentions for this week toward.  Pt affect wnl.  Pt reported decreased anxiety over past week and not longer feeling in crisis.  Pt reports she was able to enjoy weekend.  Pt reported moments of anxiety thoughts but was able to reframe and focus on enjoyable interactions.  Pt aware of distortions w/ returning home after seeing family and reframing that are happy here.  Pt discussed her want to be able to manage her emotions and regulate so can be more patient parent etc.  Pt recognized that focus for sleep,  using daily grounding and time for her and husband.  Pt discussed to do list and ways to approach to feel manageable.        Interventions: Cognitive Behavioral Therapy and Mindfulness Meditation  Diagnosis:Anxiety disorder, unspecified type  Plan: Pt to f/u in 1 week for counseling.  Pt to f/u w/PCP re: medication management.   Individualized Treatment Plan Strengths: seeking counseling for self.  Good insight. working out/jogging, cooking/baking projects, Firefighter, Product/process development scientist shopping for home. going into nature.  Supports: couple of moms through [employer] school.  Temple gardening group- couple people.  Husband's coworker.  Sister and Husband   Goal/Needs for Treatment:  In order of importance to patient 1) increase coping skills to decrease anxiety 2) --- 3) ---   Client Statement of Needs: pt "build a stronger base to cope and ways to cope w/ those bigger moments of stress and anxiety so not totally overwhelmed by things.  Change some patterns of thinking that I get into sometimes.  Cope better w/ daily and bigger stressors to feel more calm."     Treatment Level:outpatient counseling  Symptoms:anxiety, rumination, feeling overwhelmed, irrational worries/thoughts  Client Treatment Preferences:weekly counseling and medication managment w/ her PCP   Healthcare consumer's goal for treatment:  Counselor, Jan Fireman, [facility] will support the patient's ability to achieve the goals identified. Cognitive Behavioral Therapy, Assertive Communication/Conflict Resolution Training, Relaxation Training, ACT, Humanistic and other evidenced-based practices will be used to promote progress towards healthy functioning.   Healthcare consumer will: Actively participate in therapy, working towards healthy functioning.    *Justification  for Continuation/Discontinuation of Goal: R=Revised, O=Ongoing, A=Achieved, D=Discontinued  Goal 1) Increase use of daily self care and relaxation/mindfulness coping skills  to increase feeling calm and cope through stressors AEB pt report and therapist observation. Baseline date 03/24/22: Progress towards goal 0; How Often - Daily Target Date Goal Was reviewed Status Code Progress towards goal/Likert rating  03/25/23                Goal 2) Increase awareness of distortions/ anxious thoughts patterns, challenge and reframe unwanted thoughts to decrease anxiety AEB pt report/therapist observation. Baseline date 03/24/22: Progress towards goal 0; How Often - Daily Target Date Goal Was reviewed Status Code Progress towards goal  03/25/23                  This plan has been reviewed and created by the following participants:  This plan will be reviewed at least every 12 months. Date Behavioral Health Clinician Date Guardian/Patient   03/24/22  [facility] 03/25/23 Verbal Consent Provided                    Forde Radon [facility]                 [place], [facility]

## 2022-04-07 ENCOUNTER — Ambulatory Visit (INDEPENDENT_AMBULATORY_CARE_PROVIDER_SITE_OTHER): Payer: No Typology Code available for payment source | Admitting: Psychology

## 2022-04-07 DIAGNOSIS — F419 Anxiety disorder, unspecified: Secondary | ICD-10-CM | POA: Diagnosis not present

## 2022-04-07 NOTE — Progress Notes (Signed)
[facility] Counselor/Therapist Progress Note  Patient ID: Janice Matthews, MRN: [id number],    Date: 04/07/2022  Time Spent: 1:30pm-2:20pm   Pt is seen for virtual video visit via caregility.  Pt joins from her home, reporting privacy and counselor from her home office.    Treatment Type: Individual Therapy  Reported Symptoms: continued decreased anxiety, use of grounding breath, followed through w/ intentions for self care  Mental Status Exam: Appearance:  Well Groomed     Behavior: Appropriate  Motor: Normal  Speech/Language:  Normal Rate  Affect: Appropriate  Mood: normal  Thought process: normal  Thought content:   WNL  Sensory/Perceptual disturbances:   WNL  Orientation: oriented to person, place, time/date, and situation  Attention: Good  Concentration: Good  Memory: WNL  Fund of knowledge:  Good  Insight:   Good  Judgment:  Good  Impulse Control: Good   Risk Assessment: Danger to Self:  No Self-injurious Behavior: No Danger to Others: No Duty to Warn:no Physical Aggression / Violence:No  Access to Firearms a concern: No  Gang Involvement:No   Subjective: Counselor assessed pt current functioning per pt report. Processed w/ pt emotions, positive and stressors.  Reflected awareness of anxious thoughts and being able to use grounding and reframe.  Discussed positives w/ self care and intentions for interactions.   Explored upcoming stressors and encouraging self statements to manage and set boundaries if needed.  Discussed intentions for self care this week and interactions being intentional about.   Pt affect wnl.  Pt reported positive of having father in law visit.  Pt reported on following through w/ self care of getting outside, and time planned for date night at home.  Pt reported that she recognized anxious thoughts that escalating when discovered cat having accidents upstairs.  Pt was able to ground self and recognize steps can take and not  castrophize.  Pt feels she has been able to prioritize better and not feel that has to get everything down.  Pt reported that some things looking forward to upcoming and some potential stress w/ parents visit and recognizing difficulty asserting w/ dad.  Pt discussed working towards confidence of how she is adulting and parenting.  Pt discussed intentions for another date night, getting outside, outing for self this weekend, intentional about spending time w/ her kids engaging w/ them.        Interventions: Cognitive Behavioral Therapy and Mindfulness Meditation  Diagnosis:Anxiety disorder, unspecified type  Plan: Pt to f/u in 1 week for counseling.  Pt to f/u w/PCP re: medication management.   Individualized Treatment Plan Strengths: seeking counseling for self.  Good insight. working out/jogging, cooking/baking projects, Archivist, Research scientist (physical sciences) shopping for home. going into nature.  Supports: couple of moms through [employer] school.  Temple gardening group- couple people.  Husband's coworker.  Sister and Husband   Goal/Needs for Treatment:  In order of importance to patient 1) increase coping skills to decrease anxiety 2) --- 3) ---   Client Statement of Needs: pt "build a stronger base to cope and ways to cope w/ those bigger moments of stress and anxiety so not totally overwhelmed by things.  Change some patterns of thinking that I get into sometimes.  Cope better w/ daily and bigger stressors to feel more calm."     Treatment Level:outpatient counseling  Symptoms:anxiety, rumination, feeling overwhelmed, irrational worries/thoughts  Client Treatment Preferences:weekly counseling and medication managment w/ her PCP   Healthcare consumer's goal for treatment:  Counselor, Forde Radon, [facility]  will support the patient's ability to achieve the goals identified. Cognitive Behavioral Therapy, Assertive Communication/Conflict Resolution Training, Relaxation Training, ACT, Humanistic and other  evidenced-based practices will be used to promote progress towards healthy functioning.   Healthcare consumer will: Actively participate in therapy, working towards healthy functioning.    *Justification for Continuation/Discontinuation of Goal: R=Revised, O=Ongoing, A=Achieved, D=Discontinued  Goal 1) Increase use of daily self care and relaxation/mindfulness coping skills to increase feeling calm and cope through stressors AEB pt report and therapist observation. Baseline date 03/24/22: Progress towards goal 0; How Often - Daily Target Date Goal Was reviewed Status Code Progress towards goal/Likert rating  03/25/23                Goal 2) Increase awareness of distortions/ anxious thoughts patterns, challenge and reframe unwanted thoughts to decrease anxiety AEB pt report/therapist observation. Baseline date 03/24/22: Progress towards goal 0; How Often - Daily Target Date Goal Was reviewed Status Code Progress towards goal  03/25/23                  This plan has been reviewed and created by the following participants:  This plan will be reviewed at least every 12 months. Date Behavioral Health Clinician Date Guardian/Patient   03/24/22  Janice Matthews Janice Matthews [facility] 03/25/23 Janice Matthews                    Janice Matthews, [place], Janice Matthews

## 2022-04-15 IMAGING — DX DG FOOT 2V*R*
2 series · 2 of 2 positions shown · non-contrast
Comparison: None.

CLINICAL DATA: Right medial foot/great toe pain x 2 days.

EXAM:
RIGHT FOOT - 2 VIEW

[foot ap]
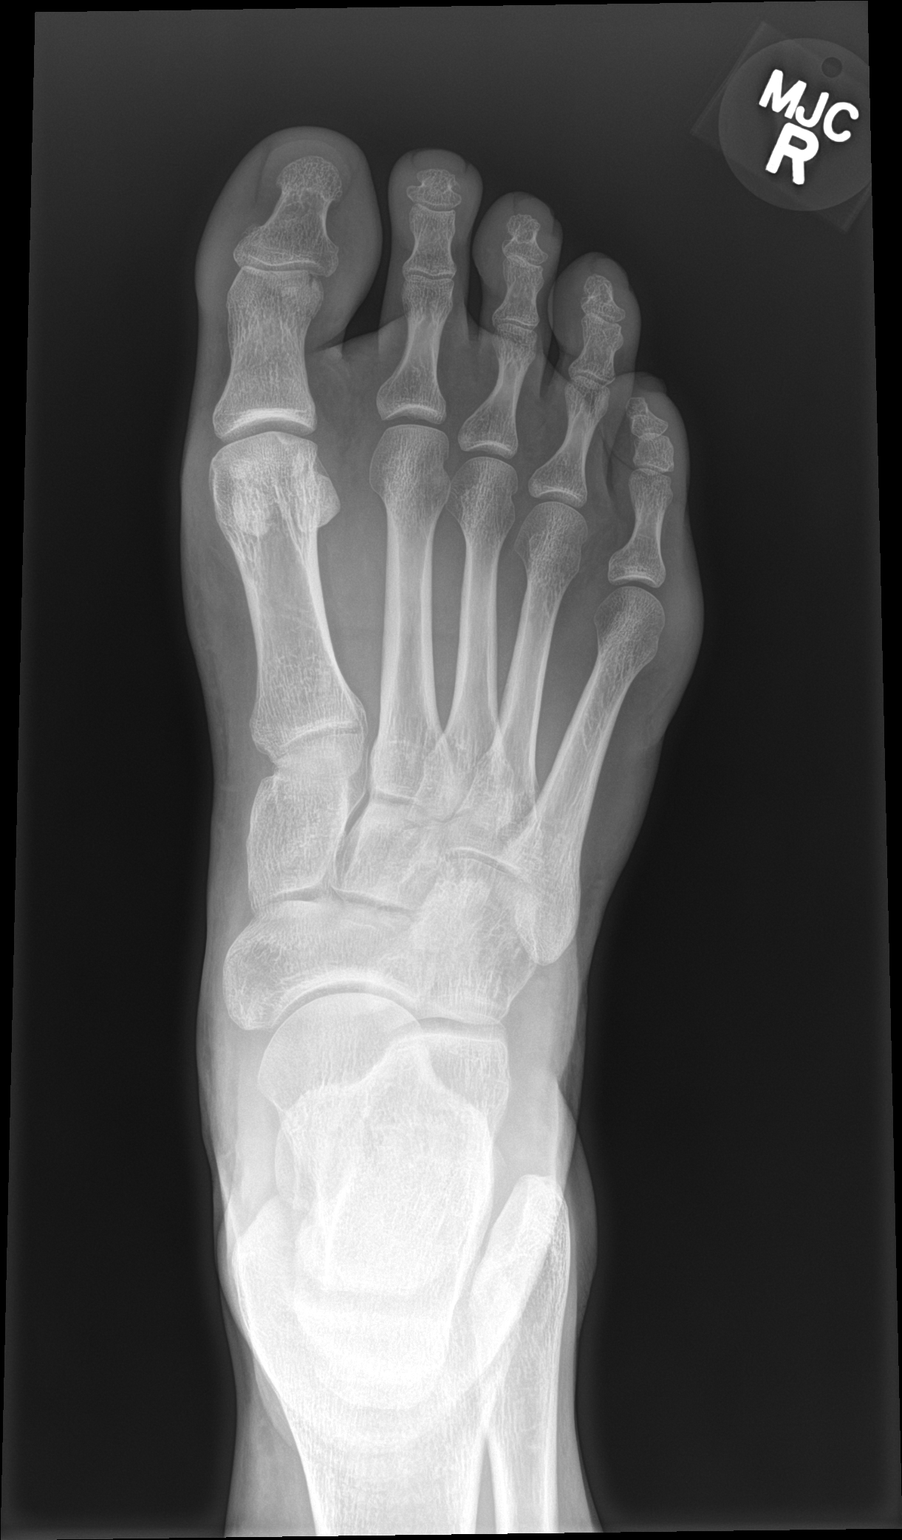

[foot lat]
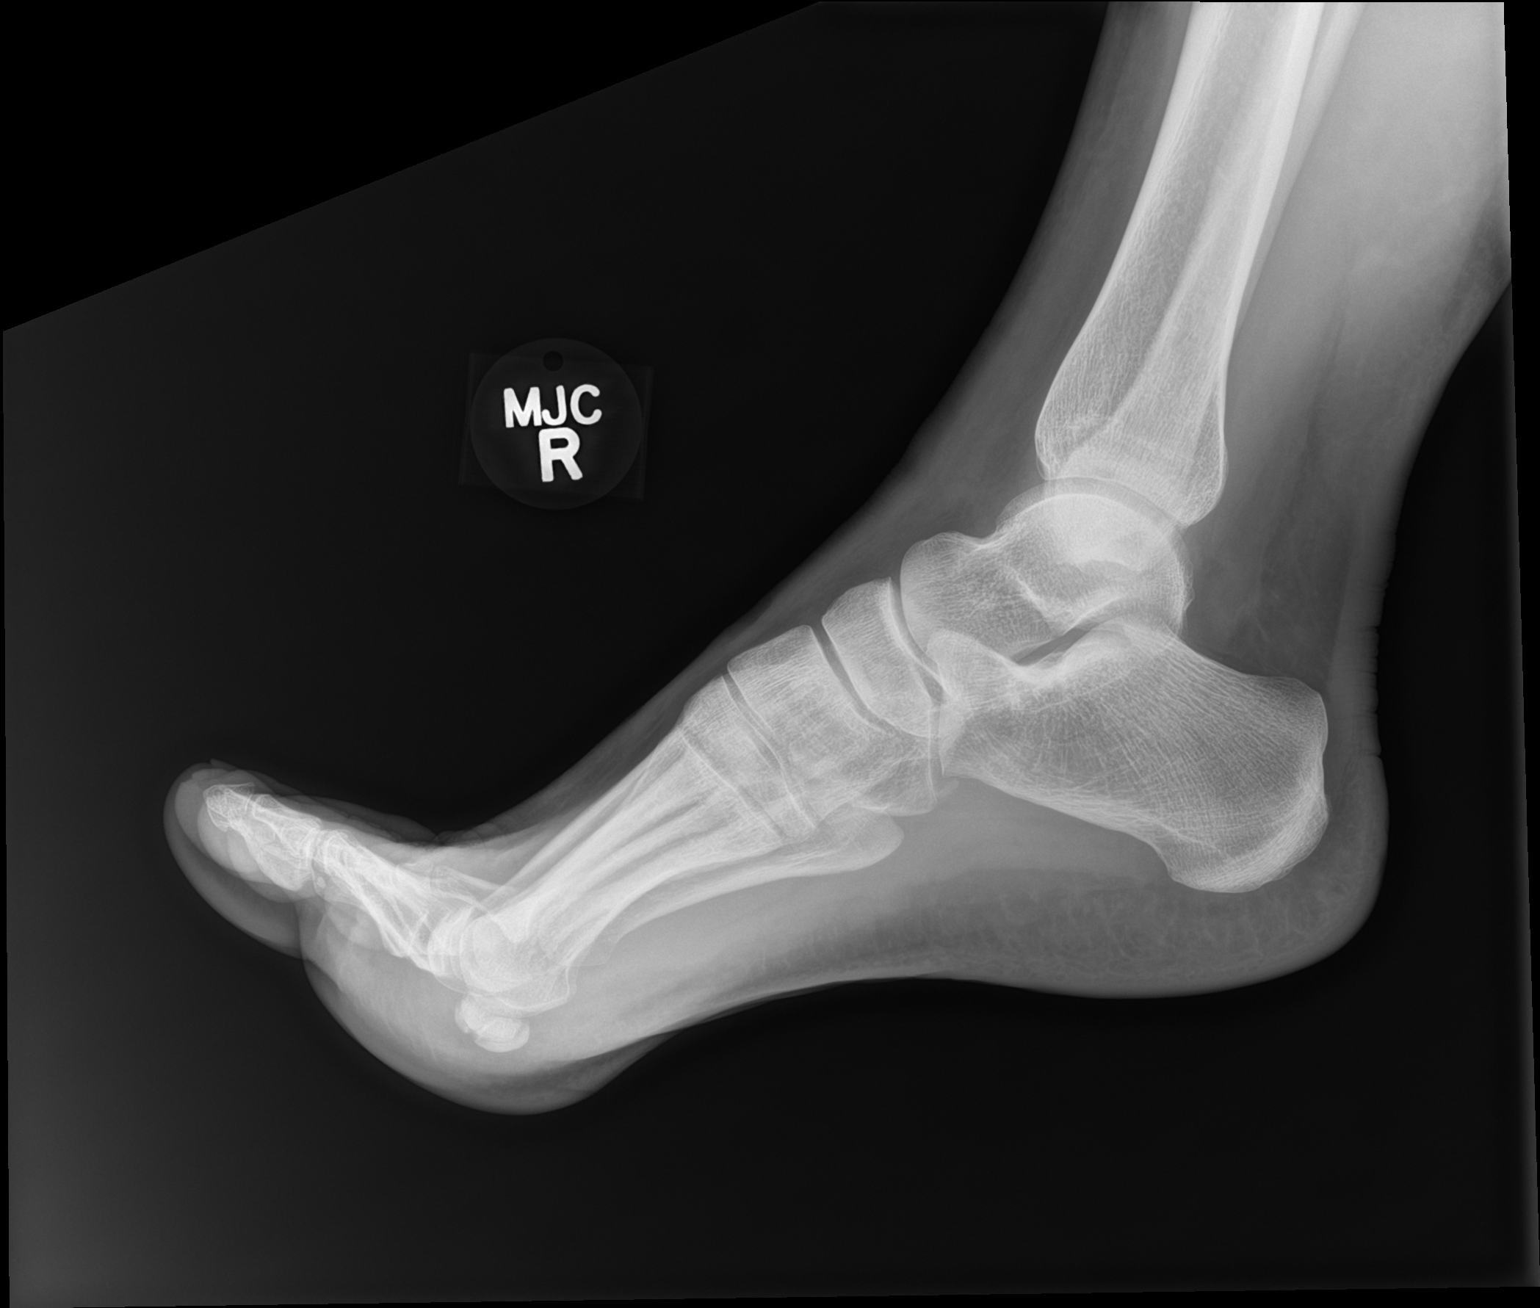

[2 of 2 positions shown; findings below may reference images not displayed]

FINDINGS: Vague intra-articular lucency along the head/neck of the first digit
proximal phalanx. Otherwise no acute displaced fracture or
dislocation. There is no evidence of arthropathy or other focal bone
abnormality. Soft tissues are unremarkable.
IMPRESSION: Vague intra-articular lucency along the head/neck of the first digit
proximal phalanx. Correlate with point tenderness to palpation to
evaluate for an acute nondisplaced fracture.

## 2022-04-16 ENCOUNTER — Ambulatory Visit (INDEPENDENT_AMBULATORY_CARE_PROVIDER_SITE_OTHER): Payer: No Typology Code available for payment source | Admitting: Psychology

## 2022-04-16 DIAGNOSIS — F419 Anxiety disorder, unspecified: Secondary | ICD-10-CM

## 2022-04-16 NOTE — Progress Notes (Signed)
[facility] Counselor/Therapist Progress Note  Patient ID: Janice Matthews, MRN: [id number],    Date: 04/16/2022  Time Spent: 1:30pm-2:16pm   Pt is seen for virtual video visit via caregility.  Pt joins from her home, reporting privacy and counselor from her home office.    Treatment Type: Individual Therapy  Reported Symptoms: continued decreased anxiety, some anxiety w/ world events.  Positive engagement and interactions  Mental Status Exam: Appearance:  Well Groomed     Behavior: Appropriate  Motor: Normal  Speech/Language:  Normal Rate  Affect: Appropriate  Mood: normal  Thought process: normal  Thought content:   WNL  Sensory/Perceptual disturbances:   WNL  Orientation: oriented to person, place, time/date, and situation  Attention: Good  Concentration: Good  Memory: WNL  Fund of knowledge:  Good  Insight:   Good  Judgment:  Good  Impulse Control: Good   Risk Assessment: Danger to Self:  No Self-injurious Behavior: No Danger to Others: No Duty to Warn:no Physical Aggression / Violence:No  Access to Firearms a concern: No  Gang Involvement:No   Subjective: Counselor assessed pt current functioning per pt report. Processed w/ pt stressors, worries and coping skills.  Reflected awareness of anxiety and how coping through, able to reframe and redirect or acknowledged what isn't helping. Explored plans for when parents come to engage in traditions. Pt affect wnl.  Pt reported overall anxiety improved and not ruminating on things.  Pt reports when having stressors or worries able to ground and approach w/ problem solving or reframing.  Pt discussed some worry w/ [place] war, being Jewish and seeing reactions from others.  Pt discussed that aware of need to back off of online/social media and ways can check in to keep informed.  Pt reported that focusing on what is in her control and not focus on perfect relationship, house, etc.  Pt reported when parents visits  planning for some activities for connection and showing parents community.  Pt discussed plans for having friends over and not focus on things being perfect.  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  Pt discussed positive of opportunity and explored what like to work again  Interventions: Engineer, manufacturing systems Therapy and Mindfulness Meditation  Diagnosis:Anxiety disorder, unspecified type  Plan: Pt to f/u in 2 weeks for counseling.  Pt to f/u w/PCP re: medication management.   Individualized Treatment Plan Strengths: seeking counseling for self.  Good insight. working out/jogging, cooking/baking projects, Archivist, Research scientist (physical sciences) shopping for home. going into nature.  Supports: couple of moms through [employer] school.  Temple gardening group- couple people.  Husband's coworker.  Sister and Husband   Goal/Needs for Treatment:  In order of importance to patient 1) increase coping skills to decrease anxiety 2) --- 3) ---   Client Statement of Needs: pt "build a stronger base to cope and ways to cope w/ those bigger moments of stress and anxiety so not totally overwhelmed by things.  Change some patterns of thinking that I get into sometimes.  Cope better w/ daily and bigger stressors to feel more calm."     Treatment Level:outpatient counseling  Symptoms:anxiety, rumination, feeling overwhelmed, irrational worries/thoughts  Client Treatment Preferences:weekly counseling and medication managment w/ her PCP   Healthcare consumer's goal for treatment:  Counselor, Forde Radon, [facility] will support the patient's ability to achieve the goals identified. Cognitive Behavioral Therapy, Assertive Communication/Conflict Resolution Training, Relaxation Training, ACT, Humanistic and  other evidenced-based practices will be used to promote progress towards healthy functioning.   Healthcare consumer will: Actively participate in therapy, working  towards healthy functioning.    *Justification for Continuation/Discontinuation of Goal: R=Revised, O=Ongoing, A=Achieved, D=Discontinued  Goal 1) Increase use of daily self care and relaxation/mindfulness coping skills to increase feeling calm and cope through stressors AEB pt report and therapist observation. Baseline date 03/24/22: Progress towards goal 0; How Often - Daily Target Date Goal Was reviewed Status Code Progress towards goal/Likert rating  03/25/23                Goal 2) Increase awareness of distortions/ anxious thoughts patterns, challenge and reframe unwanted thoughts to decrease anxiety AEB pt report/therapist observation. Baseline date 03/24/22: Progress towards goal 0; How Often - Daily Target Date Goal Was reviewed Status Code Progress towards goal  03/25/23                  This plan has been reviewed and created by the following participants:  This plan will be reviewed at least every 12 months. Date Behavioral Health Clinician Date Guardian/Patient   03/24/22  [facility] Ophelia Charter [facility] 03/25/23 Verbal Consent Provided                       Forde Radon [facility]

## 2022-04-28 ENCOUNTER — Ambulatory Visit (INDEPENDENT_AMBULATORY_CARE_PROVIDER_SITE_OTHER): Payer: No Typology Code available for payment source | Admitting: Psychology

## 2022-04-28 DIAGNOSIS — F419 Anxiety disorder, unspecified: Secondary | ICD-10-CM

## 2022-04-28 NOTE — Progress Notes (Signed)
[facility] Counselor/Therapist Progress Note  Patient ID: Janice Matthews, MRN: [id number],    Date: 04/28/2022  Time Spent: 1:31pm-2:14pm   Pt is seen for virtual video visit via caregility.  Pt joins from her home, reporting privacy and counselor from her home office.    Treatment Type: Individual Therapy  Reported Symptoms: continued decreased anxiety, some anxiety w/ world events.    Mental Status Exam: Appearance:  Well Groomed     Behavior: Appropriate  Motor: Normal  Speech/Language:  Normal Rate  Affect: Appropriate  Mood: normal  Thought process: normal  Thought content:   WNL  Sensory/Perceptual disturbances:   WNL  Orientation: oriented to person, place, time/date, and situation  Attention: Good  Concentration: Good  Memory: WNL  Fund of knowledge:  Good  Insight:   Good  Judgment:  Good  Impulse Control: Good   Risk Assessment: Danger to Self:  No Self-injurious Behavior: No Danger to Others: No Duty to Warn:no Physical Aggression / Violence:No  Access to Firearms a concern: No  Gang Involvement:No   Subjective: Counselor assessed pt current functioning per pt report. Processed w/ pt mood, positives and stressors.  Explored positive engagement and reflected perspective shift.  Discussed role of effective communication in relationship. Explored upcoming events.  Pt affect wnl.  Pt reported anxiety continued to be improved and not ruminating on things or feeling overwhelmed.  Pt reports that she has been able to enjoy engaging w/ others, having others over and not focused on what other think.  Pt reported recognizing shift in perspective and insight for self.  Pt reported on communicating and importance to keep from feeling resentful.  Pt discussed still learning balance of parenting, self and couple.  Pt discussed some worry w/ world events, recognizing perspective sees online social media isn't representative of majority and not her job to respond.   Pt discussed upcoming weekend away and family visiting.    Diagnosis:Anxiety disorder, unspecified type  Plan: Pt to f/u in 2 weeks for counseling.  Pt to f/u w/PCP re: medication management.   Individualized Treatment Plan Strengths: seeking counseling for self.  Good insight. working out/jogging, cooking/baking projects, Archivist, Research scientist (physical sciences) shopping for home. going into nature.  Supports: couple of moms through [employer] school.  Temple gardening group- couple people.  Husband's coworker.  Sister and Husband   Goal/Needs for Treatment:  In order of importance to patient 1) increase coping skills to decrease anxiety 2) --- 3) ---   Client Statement of Needs: pt "build a stronger base to cope and ways to cope w/ those bigger moments of stress and anxiety so not totally overwhelmed by things.  Change some patterns of thinking that I get into sometimes.  Cope better w/ daily and bigger stressors to feel more calm."     Treatment Level:outpatient counseling  Symptoms:anxiety, rumination, feeling overwhelmed, irrational worries/thoughts  Client Treatment Preferences:weekly counseling and medication managment w/ her PCP   Healthcare consumer's goal for treatment:  Counselor, Forde Radon, [facility] will support the patient's ability to achieve the goals identified. Cognitive Behavioral Therapy, Assertive Communication/Conflict Resolution Training, Relaxation Training, ACT, Humanistic and other evidenced-based practices will be used to promote progress towards healthy functioning.   Healthcare consumer will: Actively participate in therapy, working towards healthy functioning.    *Justification for Continuation/Discontinuation of Goal: R=Revised, O=Ongoing, A=Achieved, D=Discontinued  Goal 1) Increase use of daily self care and relaxation/mindfulness coping skills to increase feeling calm and cope through stressors AEB pt report and therapist observation.  Baseline date 03/24/22: Progress towards goal  0; How Often - Daily Target Date Goal Was reviewed Status Code Progress towards goal/Likert rating  03/25/23                Goal 2) Increase awareness of distortions/ anxious thoughts patterns, challenge and reframe unwanted thoughts to decrease anxiety AEB pt report/therapist observation. Baseline date 03/24/22: Progress towards goal 0; How Often - Daily Target Date Goal Was reviewed Status Code Progress towards goal  03/25/23                  This plan has been reviewed and created by the following participants:  This plan will be reviewed at least every 12 months. Date Behavioral Health Clinician Date Guardian/Patient   03/24/22  [facility] Ophelia Charter [facility] 03/25/23 Verbal Consent Provided                       Forde Radon [facility]

## 2022-05-14 ENCOUNTER — Ambulatory Visit: Payer: No Typology Code available for payment source | Admitting: Psychology

## 2022-05-15 ENCOUNTER — Encounter: Payer: Self-pay | Admitting: *Deleted

## 2022-05-22 ENCOUNTER — Ambulatory Visit (INDEPENDENT_AMBULATORY_CARE_PROVIDER_SITE_OTHER): Payer: No Typology Code available for payment source | Admitting: Family Medicine

## 2022-05-22 ENCOUNTER — Other Ambulatory Visit (HOSPITAL_COMMUNITY): Payer: Self-pay

## 2022-05-22 ENCOUNTER — Encounter: Payer: Self-pay | Admitting: Family Medicine

## 2022-05-22 DIAGNOSIS — R509 Fever, unspecified: Secondary | ICD-10-CM | POA: Diagnosis not present

## 2022-05-22 DIAGNOSIS — J020 Streptococcal pharyngitis: Secondary | ICD-10-CM

## 2022-05-22 DIAGNOSIS — J029 Acute pharyngitis, unspecified: Secondary | ICD-10-CM

## 2022-05-22 LAB — POCT RAPID STREP A (OFFICE): Rapid Strep A Screen: POSITIVE — AB

## 2022-05-22 LAB — POCT INFLUENZA B: Rapid Influenza B Ag: NEGATIVE

## 2022-05-22 LAB — POCT INFLUENZA A: Rapid Influenza A Ag: NEGATIVE

## 2022-05-22 MED ORDER — AMOXICILLIN-POT CLAVULANATE 875-125 MG PO TABS
1.0000 | ORAL_TABLET | Freq: Two times a day (BID) | ORAL | 1 refills | Status: DC
  Filled 2022-05-22: qty 20, 10d supply, fill #0

## 2022-05-22 NOTE — Progress Notes (Signed)
    Subjective:   Benigna Delisi is a 36 y.o. female previously healthy presenting for  Chief Complaint  Patient presents with   Fever   Sore Throat     Parent reports symptoms started 2 days ago. Current symptoms include fevers up to 102 degrees, chills, enlarged tonsils, sinus and nasal congestion, and sore throat. No cough. + sore throat and child recently treated for strep.  Took iburpofen at 830  Sick contacts? Yes  Eating/drinking normally? Yes but hard to swallow  Immunization History  Administered Date(s) Administered   Influenza,inj,Quad PF,6+ Mos 03/08/2019, 02/10/2020   Influenza-Unspecified 02/16/2017, 03/01/2021   PFIZER(Purple Top)SARS-COV-2 Vaccination 08/22/2019, 09/12/2019, 03/14/2020   Pfizer Covid-19 Vaccine Bivalent Booster 73yrs & up 02/06/2021   Rabies Immune Globulin 01/17/2016, 01/20/2016, 01/24/2016   Rabies, IM 01/31/2016   Tdap 10/06/2016, 12/01/2019     PMH, PSH, Medications, Allergies, and FmHx reviewed and updated in EMR.  Social History: Non smoker  Objective:  LMP  (LMP Unknown)   Breastfeeding No  Growth %ile SmartLinks can only be used for patients less than 70 years old.  Gen:  36 y.o. female in NAD HEENT: NCAT, MMM, EOMI, PERRL, anicteric sclerae. + erythematous and swollen tonsils, possible pus on the left.  CV: RRR, no MRG, no JVD Resp: Non-labored, no wheezes noted. Moving air well Abd: Soft, NTND, BS present, no guarding or organomegaly Ext: WWP, no edema MSK: Full ROM, strength intact Neuro: Alert and oriented, speech normal  Assessment:     Miku Udall is a 36 y.o. female here for sore throat with Step A pos testing   Plan:   Orders Placed This Encounter  Procedures   POCT Influenza A   POCT Influenza B   POCT rapid strep A    Pharyngitis due to group A beta hemolytic Streptococci -     Amoxicillin-Pot Clavulanate; Take 1 tablet by mouth 2 (two) times daily.  Dispense: 20 tablet; Refill: 1  Sore throat -      POCT Influenza A -     POCT Influenza B -     POCT rapid strep A  Fever, unspecified fever cause -     POCT Influenza A -     POCT Influenza B -     POCT rapid strep A    Federico Flake, MD 05/22/2022  1:18 PM

## 2022-07-20 ENCOUNTER — Encounter: Payer: Self-pay | Admitting: Family Medicine

## 2022-07-20 MED ORDER — NORETHINDRONE 0.35 MG PO TABS
1.0000 | ORAL_TABLET | Freq: Every day | ORAL | 0 refills | Status: DC
  Filled 2022-07-20: qty 84, 84d supply, fill #0

## 2022-07-21 ENCOUNTER — Other Ambulatory Visit: Payer: Self-pay

## 2022-10-15 ENCOUNTER — Other Ambulatory Visit: Payer: Self-pay

## 2022-10-15 ENCOUNTER — Other Ambulatory Visit: Payer: Self-pay | Admitting: Family Medicine

## 2022-10-15 ENCOUNTER — Other Ambulatory Visit (HOSPITAL_COMMUNITY): Payer: Self-pay

## 2022-10-15 DIAGNOSIS — Z3041 Encounter for surveillance of contraceptive pills: Secondary | ICD-10-CM

## 2022-10-15 MED ORDER — NORGESTIMATE-ETH ESTRADIOL 0.25-35 MG-MCG PO TABS
1.0000 | ORAL_TABLET | Freq: Every day | ORAL | 11 refills | Status: DC
  Filled 2022-10-15: qty 28, 28d supply, fill #0
  Filled 2022-11-19: qty 28, 28d supply, fill #1
  Filled 2022-12-09: qty 84, 84d supply, fill #2
  Filled 2022-12-11: qty 28, 28d supply, fill #2
  Filled 2022-12-11: qty 84, 84d supply, fill #2

## 2022-12-09 ENCOUNTER — Other Ambulatory Visit (HOSPITAL_COMMUNITY): Payer: Self-pay

## 2022-12-11 ENCOUNTER — Other Ambulatory Visit (HOSPITAL_COMMUNITY): Payer: Self-pay

## 2022-12-22 ENCOUNTER — Other Ambulatory Visit (HOSPITAL_COMMUNITY): Payer: Self-pay

## 2022-12-22 ENCOUNTER — Encounter: Payer: Self-pay | Admitting: Family Medicine

## 2022-12-22 ENCOUNTER — Ambulatory Visit (INDEPENDENT_AMBULATORY_CARE_PROVIDER_SITE_OTHER): Payer: 59 | Admitting: Family Medicine

## 2022-12-22 VITALS — BP 124/80 | HR 94 | Temp 98.2°F | Ht 65.0 in | Wt 148.4 lb

## 2022-12-22 DIAGNOSIS — N943 Premenstrual tension syndrome: Secondary | ICD-10-CM

## 2022-12-22 DIAGNOSIS — F419 Anxiety disorder, unspecified: Secondary | ICD-10-CM | POA: Diagnosis not present

## 2022-12-22 DIAGNOSIS — H8110 Benign paroxysmal vertigo, unspecified ear: Secondary | ICD-10-CM

## 2022-12-22 MED ORDER — ONDANSETRON HCL 4 MG PO TABS
4.0000 mg | ORAL_TABLET | Freq: Three times a day (TID) | ORAL | 0 refills | Status: AC | PRN
  Filled 2022-12-22: qty 20, 7d supply, fill #0

## 2022-12-22 NOTE — Assessment & Plan Note (Signed)
No red flags.  Uses Epley maneuver at home during flares.  Also has Zofran to use as needed.  Will refill this today.

## 2022-12-22 NOTE — Progress Notes (Signed)
Chief Complaint:  Janice Matthews is a 37 y.o. female who presents today for her annual comprehensive physical exam.    Assessment/Plan:  Chronic Problems Addressed Today: BPPV (benign paroxysmal positional vertigo) No red flags.  Uses Epley maneuver at home during flares.  Also has Zofran to use as needed.  Will refill this today.  PMS (premenstrual syndrome) Currently on OCP however is having some breakthrough bleeding.  She is interested in switching to IUD to see if this can help.  She has been on this in the past and has done well with this. She will come back to discuss with Dr Mardelle Matte.   Anxiety Symptoms are currently well-controlled.Marland Kitchen  Has hydroxyzine on hand to use as needed.   Preventative Healthcare: Due for pap in 2 years.  Up-to-date on vaccines and screenings.  Patient Counseling(The following topics were reviewed and/or handout was given):  -Nutrition: Stressed importance of moderation in sodium/caffeine intake, saturated fat and cholesterol, caloric balance, sufficient intake of fresh fruits, vegetables, and fiber.  -Stressed the importance of regular exercise.   -Substance Abuse: Discussed cessation/primary prevention of tobacco, alcohol, or other drug use; driving or other dangerous activities under the influence; availability of treatment for abuse.   -Injury prevention: Discussed safety belts, safety helmets, smoke detector, smoking near bedding or upholstery.   -Sexuality: Discussed sexually transmitted diseases, partner selection, use of condoms, avoidance of unintended pregnancy and contraceptive alternatives.   -Dental health: Discussed importance of regular tooth brushing, flossing, and dental visits.  -Health maintenance and immunizations reviewed. Please refer to Health maintenance section.  Return to care in 1 year for next preventative visit.     Subjective:  HPI:  She has no acute complaints today. See Assessment / plan for status of chronic conditions.   She has been having some ongoing issues with PMS.  She has been prescribed progestin only pill which she did not tolerate.  Recently has been on OCP however this is caused some breakthrough bleeding.  Predominately gets cramping, bloating, and emotional changes several days before her cycle.  She has had an IUD in the past and is interested in having this done again.  Lifestyle Diet: Balanced. Plenty of fruits and vegetables.  Exercise: Getting back into routine of 3 days per week.      12/22/2022    1:21 PM  Depression screen PHQ 2/9  Decreased Interest 0  Down, Depressed, Hopeless 0  PHQ - 2 Score 0   There are no preventive care reminders to display for this patient.   ROS: Per HPI, otherwise a complete review of systems was negative.   PMH:  The following were reviewed and entered/updated in epic: Past Medical History:  Diagnosis Date   Hypertension    Patient Active Problem List   Diagnosis Date Noted   BPPV (benign paroxysmal positional vertigo) 12/22/2022   PMS (premenstrual syndrome) 12/22/2022   Anxiety 12/22/2022   Vitamin D deficiency 10/23/2021   History reviewed. No pertinent surgical history.  Family History  Problem Relation Age of Onset   Diabetes Mother    Thyroid disease Mother     Medications- reviewed and updated Current Outpatient Medications  Medication Sig Dispense Refill   hydrOXYzine (ATARAX) 10 MG tablet Take 1 tablet (10 mg total) by mouth 3 (three) times daily as needed for anxiety. 20 tablet 0   norgestimate-ethinyl estradiol (ORTHO-CYCLEN) 0.25-35 MG-MCG tablet Take 1 tablet by mouth daily. 28 tablet 11   ondansetron (ZOFRAN) 4 MG tablet Take 1  tablet (4 mg total) by mouth every 8 (eight) hours as needed for nausea or vomiting. 20 tablet 0   No current facility-administered medications for this visit.    Allergies-reviewed and updated No Known Allergies  Social History   Socioeconomic History   Marital status: Married    Spouse  name: Not on file   Number of children: 2   Years of education: Not on file   Highest education level: Bachelor's degree (e.g., BA, AB, BS)  Occupational History   Not on file  Tobacco Use   Smoking status: Never   Smokeless tobacco: Never  Vaping Use   Vaping status: Never Used  Substance and Sexual Activity   Alcohol use: Not Currently    Comment: occ   Drug use: Never   Sexual activity: Yes    Birth control/protection: None  Other Topics Concern   Not on file  Social History Narrative   Not on file   Social Determinants of Health   Financial Resource Strain: Not on file  Food Insecurity: Not on file  Transportation Needs: Not on file  Physical Activity: Not on file  Stress: Not on file  Social Connections: Not on file        Objective:  Physical Exam: BP 124/80   Pulse 94   Temp 98.2 F (36.8 C)   Ht 5\' 5"  (1.651 m)   Wt 148 lb 6.4 oz (67.3 kg)   SpO2 96%   BMI 24.70 kg/m   Body mass index is 24.7 kg/m. Wt Readings from Last 3 Encounters:  12/22/22 148 lb 6.4 oz (67.3 kg)  03/11/22 144 lb (65.3 kg)  10/23/21 144 lb (65.3 kg)   Gen: NAD, resting comfortably HEENT: TMs normal bilaterally. OP clear. No thyromegaly noted.  CV: RRR with no murmurs appreciated Pulm: NWOB, CTAB with no crackles, wheezes, or rhonchi GI: Normal bowel sounds present. Soft, Nontender, Nondistended. MSK: no edema, cyanosis, or clubbing noted Skin: warm, dry Neuro: CN2-12 grossly intact. Strength 5/5 in upper and lower extremities. Reflexes symmetric and intact bilaterally.  Psych: Normal affect and thought content     Janice Matthews M. Jimmey Ralph, MD 12/22/2022 1:53 PM

## 2022-12-22 NOTE — Assessment & Plan Note (Addendum)
Currently on OCP however is having some breakthrough bleeding.  She is interested in switching to IUD to see if this can help.  She has been on this in the past and has done well with this. She will come back to discuss with Dr Mardelle Matte.

## 2022-12-22 NOTE — Assessment & Plan Note (Addendum)
Symptoms are currently well-controlled.Marland Kitchen  Has hydroxyzine on hand to use as needed.

## 2022-12-22 NOTE — Patient Instructions (Addendum)
It was very nice to see you today!  I will refill your refill your Zofran today.  Please come back to see Dr. Mardelle Matte soon to discuss IUD.  Return in about 1 year (around 12/22/2023) for Annual Physical.   Take care, Dr Jimmey Ralph  PLEASE NOTE:  If you had any lab tests, please let us know if you have not heard back within a few days. You may see your results on mychart before we have a chance to review them but we will give you a call once they are reviewed by [place].   If we ordered any referrals today, please let us know if you have not heard from their office within the next week.   If you had any urgent prescriptions sent in today, please check with the pharmacy within an hour of our visit to make sure the prescription was transmitted appropriately.   Please try these tips to maintain a healthy lifestyle:  Eat at least 3 REAL meals and 1-2 snacks per day.  Aim for no more than 5 hours between eating.  If you eat breakfast, please do so within one hour of getting up.   Each meal should contain half fruits/vegetables, one quarter protein, and one quarter carbs (no bigger than a computer mouse)  Cut down on sweet beverages. This includes juice, soda, and sweet tea.   Drink at least 1 glass of water with each meal and aim for at least 8 glasses per day  Exercise at least 150 minutes every week.    Preventive Care 30-48 Years Old, Female Preventive care refers to lifestyle choices and visits with your health care provider that can promote health and wellness. Preventive care visits are also called wellness exams. What can I expect for my preventive care visit? Counseling During your preventive care visit, your health care provider may ask about your: Medical history, including: Past medical problems. Family medical history. Pregnancy history. Current health, including: Menstrual cycle. Method of birth control. Emotional well-being. Home life and relationship well-being. Sexual  activity and sexual health. Lifestyle, including: Alcohol, nicotine or tobacco, and drug use. Access to firearms. Diet, exercise, and sleep habits. Work and work Astronomer. Sunscreen use. Safety issues such as seatbelt and bike helmet use. Physical exam Your health care provider may check your: Height and weight. These may be used to calculate your BMI (body mass index). BMI is a measurement that tells if you are at a healthy weight. Waist circumference. This measures the distance around your waistline. This measurement also tells if you are at a healthy weight and may help predict your risk of certain diseases, such as type 2 diabetes and high blood pressure. Heart rate and blood pressure. Body temperature. Skin for abnormal spots. What immunizations do I need?  Vaccines are usually given at various ages, according to a schedule. Your health care provider will recommend vaccines for you based on your age, medical history, and lifestyle or other factors, such as travel or where you work. What tests do I need? Screening Your health care provider may recommend screening tests for certain conditions. This may include: Pelvic exam and Pap test. Lipid and cholesterol levels. Diabetes screening. This is done by checking your blood sugar (glucose) after you have not eaten for a while (fasting). Hepatitis B test. Hepatitis C test. HIV (human immunodeficiency virus) test. STI (sexually transmitted infection) testing, if you are at risk. BRCA-related cancer screening. This may be done if you have a family history of breast,  ovarian, tubal, or peritoneal cancers. Talk with your health care provider about your test results, treatment options, and if necessary, the need for more tests. Follow these instructions at home: Eating and drinking  Eat a healthy diet that includes fresh fruits and vegetables, whole grains, lean protein, and low-fat dairy products. Take vitamin and mineral supplements  as recommended by your health care provider. Do not drink alcohol if: Your health care provider tells you not to drink. You are pregnant, may be pregnant, or are planning to become pregnant. If you drink alcohol: Limit how much you have to 0-1 drink a day. Know how much alcohol is in your drink. In the [place] one drink equals one 12 oz bottle of beer (355 mL), one 5 oz glass of wine (148 mL), or one 1 oz glass of hard liquor (44 mL). Lifestyle Brush your teeth every morning and night with fluoride toothpaste. Floss one time each day. Exercise for at least 30 minutes 5 or more days each week. Do not use any products that contain nicotine or tobacco. These products include cigarettes, chewing tobacco, and vaping devices, such as e-cigarettes. If you need help quitting, ask your health care provider. Do not use drugs. If you are sexually active, practice safe sex. Use a condom or other form of protection to prevent STIs. If you do not wish to become pregnant, use a form of birth control. If you plan to become pregnant, see your health care provider for a prepregnancy visit. Find healthy ways to manage stress, such as: Meditation, yoga, or listening to music. Journaling. Talking to a trusted person. Spending time with friends and family. Minimize exposure to UV radiation to reduce your risk of skin cancer. Safety Always wear your seat belt while driving or riding in a vehicle. Do not drive: If you have been drinking alcohol. Do not ride with someone who has been drinking. If you have been using any mind-altering substances or drugs. While texting. When you are tired or distracted. Wear a helmet and other protective equipment during sports activities. If you have firearms in your house, make sure you follow all gun safety procedures. Seek help if you have been physically or sexually abused. What's next? Go to your health care provider once a year for an annual wellness visit. Ask your  health care provider how often you should have your eyes and teeth checked. Stay up to date on all vaccines. This information is not intended to replace advice given to you by your health care provider. Make sure you discuss any questions you have with your health care provider. Document Revised: 11/14/2020 Document Reviewed: 11/14/2020 Elsevier Patient Education  2024 [employer].

## 2022-12-23 ENCOUNTER — Encounter: Payer: 59 | Admitting: Family Medicine

## 2023-01-15 ENCOUNTER — Encounter (INDEPENDENT_AMBULATORY_CARE_PROVIDER_SITE_OTHER): Payer: Self-pay

## 2023-02-16 ENCOUNTER — Ambulatory Visit: Payer: 59 | Admitting: Family Medicine

## 2023-02-23 ENCOUNTER — Ambulatory Visit: Payer: 59 | Admitting: Family Medicine

## 2023-10-29 ENCOUNTER — Encounter: Payer: Self-pay | Admitting: Family Medicine

## 2023-12-24 ENCOUNTER — Ambulatory Visit (INDEPENDENT_AMBULATORY_CARE_PROVIDER_SITE_OTHER): Payer: Self-pay | Admitting: Family Medicine

## 2023-12-24 ENCOUNTER — Encounter: Payer: Self-pay | Admitting: Family Medicine

## 2023-12-24 VITALS — BP 106/73 | HR 77 | Temp 97.3°F | Ht 65.0 in | Wt 148.8 lb

## 2023-12-24 DIAGNOSIS — F419 Anxiety disorder, unspecified: Secondary | ICD-10-CM | POA: Diagnosis not present

## 2023-12-24 DIAGNOSIS — Z23 Encounter for immunization: Secondary | ICD-10-CM | POA: Diagnosis not present

## 2023-12-24 DIAGNOSIS — H8110 Benign paroxysmal vertigo, unspecified ear: Secondary | ICD-10-CM | POA: Diagnosis not present

## 2023-12-24 DIAGNOSIS — Z13220 Encounter for screening for lipoid disorders: Secondary | ICD-10-CM | POA: Diagnosis not present

## 2023-12-24 DIAGNOSIS — E559 Vitamin D deficiency, unspecified: Secondary | ICD-10-CM

## 2023-12-24 DIAGNOSIS — I8390 Asymptomatic varicose veins of unspecified lower extremity: Secondary | ICD-10-CM

## 2023-12-24 DIAGNOSIS — Z131 Encounter for screening for diabetes mellitus: Secondary | ICD-10-CM | POA: Diagnosis not present

## 2023-12-24 DIAGNOSIS — Z0001 Encounter for general adult medical examination with abnormal findings: Secondary | ICD-10-CM

## 2023-12-24 LAB — COMPREHENSIVE METABOLIC PANEL WITH GFR
ALT: 13 U/L (ref 0–35)
AST: 20 U/L (ref 0–37)
Albumin: 4.5 g/dL (ref 3.5–5.2)
Alkaline Phosphatase: 84 U/L (ref 39–117)
BUN: 14 mg/dL (ref 6–23)
CO2: 30 meq/L (ref 19–32)
Calcium: 9.7 mg/dL (ref 8.4–10.5)
Chloride: 100 meq/L (ref 96–112)
Creatinine, Ser: 0.96 mg/dL (ref 0.40–1.20)
GFR: 75.06 mL/min (ref >60.00)
Glucose, Bld: 69 mg/dL — ABNORMAL LOW (ref 70–99)
Potassium: 4.2 meq/L (ref 3.5–5.1)
Sodium: 138 meq/L (ref 135–145)
Total Bilirubin: 0.6 mg/dL (ref 0.2–1.2)
Total Protein: 7.1 g/dL (ref 6.0–8.3)

## 2023-12-24 LAB — CBC
HCT: 42.1 % (ref 36.0–46.0)
Hemoglobin: 13.9 g/dL (ref 12.0–15.0)
MCHC: 33 g/dL (ref 30.0–36.0)
MCV: 87.7 fl (ref 78.0–100.0)
Platelets: 341 K/uL (ref 150.0–400.0)
RBC: 4.8 Mil/uL (ref 3.87–5.11)
RDW: 13.8 % (ref 11.5–15.5)
WBC: 7.4 K/uL (ref 4.0–10.5)

## 2023-12-24 LAB — LIPID PANEL
Cholesterol: 184 mg/dL (ref 0–200)
HDL: 85.8 mg/dL (ref >39.00)
LDL Cholesterol: 82 mg/dL (ref 0–99)
NonHDL: 98.21
Total CHOL/HDL Ratio: 2
Triglycerides: 82 mg/dL (ref 0.0–149.0)
VLDL: 16.4 mg/dL (ref 0.0–40.0)

## 2023-12-24 LAB — HEMOGLOBIN A1C: Hgb A1c MFr Bld: 5.9 % (ref 4.6–6.5)

## 2023-12-24 LAB — VITAMIN D 25 HYDROXY (VIT D DEFICIENCY, FRACTURES): VITD: 40.14 ng/mL (ref 30.00–100.00)

## 2023-12-24 LAB — TSH: TSH: 1.43 u[IU]/mL (ref 0.35–5.50)

## 2023-12-24 NOTE — Assessment & Plan Note (Signed)
 Very well-controlled on hydroxyzine  as needed.  Last refill was almost 3 years ago.

## 2023-12-24 NOTE — Assessment & Plan Note (Signed)
 Check vitamin D.

## 2023-12-24 NOTE — Progress Notes (Signed)
 Chief Complaint:  Janice Matthews is a 38 y.o. female who presents today for her annual comprehensive physical exam.    Assessment/Plan:  Chronic Problems Addressed Today: Vitamin D  deficiency Check vitamin D .   Anxiety Very well-controlled on hydroxyzine  as needed.  Last refill was almost 3 years ago.  BPPV (benign paroxysmal positional vertigo) No recent flareups.  Occasionally does Epley maneuver's at home.  We also did discuss vestibular rehab.  She like to hold off on this for now though she will let us  know if she changes her mind.  Has Zofran  to use as needed at home as well.  Does not need refill today.  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  She does occasionally get painful varicosity along medial aspect of right calf.  Symptoms are currently management.  We did discuss conservative measures including leg elevation and compression.  We discussed referral to vascular surgery however she would like to hold off on this for now.  Encounter for form completion School form completed today.  She has no risk factors for tuberculosis.  Preventative Healthcare: Check labs.  First HPV given today.  Due for Pap next year.  Patient Counseling(The following topics were reviewed and/or handout was given):  -Nutrition: Stressed importance of moderation in sodium/caffeine intake, saturated fat and cholesterol, caloric balance, sufficient intake of fresh fruits, vegetables, and fiber.  -Stressed the importance of regular exercise.   -Substance Abuse: Discussed cessation/primary prevention of tobacco, alcohol, or other drug use; driving or other dangerous activities under the influence; availability of treatment for abuse.   -Injury prevention: Discussed safety belts, safety helmets, smoke detector, smoking near bedding or upholstery.   -Sexuality: Discussed sexually transmitted diseases, partner selection, use of condoms, avoidance of  unintended pregnancy and contraceptive alternatives.   -Dental health: Discussed importance of regular tooth brushing, flossing, and dental visits.  -Health maintenance and immunizations reviewed. Please refer to Health maintenance section.  Return to care in 1 year for next preventative visit.     Subjective:  HPI:  She has no acute complaints today.  See a/p status of chronic conditions. Patient is here today for annual physical.  She has paperwork that she would like to have completed today for work.      12/24/2023    9:16 AM  Depression screen PHQ 2/9  Decreased Interest 0  Down, Depressed, Hopeless 0  PHQ - 2 Score 0    There are no preventive care reminders to display for this patient.   ROS: Per HPI, otherwise a complete review of systems was negative.   PMH:  The following were reviewed and entered/updated in epic: Past Medical History:  Diagnosis Date   Hypertension    Patient Active Problem List   Diagnosis Date Noted   Varicose vein of leg 12/24/2023   BPPV (benign paroxysmal positional vertigo) 12/22/2022   PMS (premenstrual syndrome) 12/22/2022   Anxiety 12/22/2022   Vitamin D  deficiency 10/23/2021   History reviewed. No pertinent surgical history.  Family History  Problem Relation Age of Onset   Diabetes Mother    Thyroid  disease Mother     Medications- reviewed and updated Current Outpatient Medications  Medication Sig Dispense Refill   hydrOXYzine  (ATARAX ) 10 MG tablet Take 1 tablet (10 mg total) by mouth 3 (three) times daily as needed for anxiety. 20 tablet 0   ondansetron  (ZOFRAN ) 4 MG tablet Take 1 tablet (4 mg total) by mouth every 8 (eight) hours as  needed for nausea or vomiting. 20 tablet 0   No current facility-administered medications for this visit.    Allergies-reviewed and updated No Known Allergies  Social History   Socioeconomic History   Marital status: Married    Spouse name: Not on file   Number of children: 2   Years  of education: Not on file   Highest education level: Bachelor's degree (e.g., BA, AB, BS)  Occupational History   Not on file  Tobacco Use   Smoking status: Never   Smokeless tobacco: Never  Vaping Use   Vaping status: Never Used  Substance and Sexual Activity   Alcohol use: Not Currently    Comment: occ   Drug use: Never   Sexual activity: Yes    Birth control/protection: None  Other Topics Concern   Not on file  Social History Narrative   Not on file   Social Drivers of Health   Financial Resource Strain: Low Risk  (12/20/2023)   Overall Financial Resource Strain (CARDIA)    Difficulty of Paying Living Expenses: Not hard at all  Food Insecurity: No Food Insecurity (12/20/2023)   Hunger Vital Sign    Worried About Running Out of Food in the Last Year: Never true    Ran Out of Food in the Last Year: Never true  Transportation Needs: Not on file  Physical Activity: Sufficiently Active (12/20/2023)   Exercise Vital Sign    Days of Exercise per Week: 5 days    Minutes of Exercise per Session: 30 min  Stress: No Stress Concern Present (12/20/2023)   [employer] of Occupational Health - Occupational Stress Questionnaire    Feeling of Stress: Only a little  Social Connections: Socially Integrated (12/20/2023)   Social Connection and Isolation Panel    Frequency of Communication with Friends and Family: More than three times a week    Frequency of Social Gatherings with Friends and Family: Once a week    Attends Religious Services: More than 4 times per year    Active Member of [employer] or Organizations: Yes    Attends Engineer, structural: More than 4 times per year    Marital Status: Married        Objective:  Physical Exam: BP 106/73   Pulse 77   Temp (!) 97.3 F (36.3 C) (Temporal)   Ht 5' 5 (1.651 m)   Wt 148 lb 12.8 oz (67.5 kg)   LMP 12/21/2023   SpO2 99%   BMI 24.76 kg/m   Body mass index is 24.76 kg/m. Wt Readings from Last 3 Encounters:   12/24/23 148 lb 12.8 oz (67.5 kg)  12/22/22 148 lb 6.4 oz (67.3 kg)  03/11/22 144 lb (65.3 kg)   Gen: NAD, resting comfortably HEENT: TMs normal bilaterally. OP clear. No thyromegaly noted.  CV: RRR with no murmurs appreciated Pulm: NWOB, CTAB with no crackles, wheezes, or rhonchi GI: Normal bowel sounds present. Soft, Nontender, Nondistended. MSK: no edema, cyanosis, or clubbing noted Skin: warm, dry.  Several scattered varicosities legs bilaterally Neuro: CN2-12 grossly intact. Strength 5/5 in upper and lower extremities. Reflexes symmetric and intact bilaterally.  Psych: Normal affect and thought content     Ziyad Dyar M. Kennyth, MD 12/24/2023 9:52 AM

## 2023-12-24 NOTE — Assessment & Plan Note (Signed)
 No recent flareups.  Occasionally does Epley maneuver's at home.  We also did discuss vestibular rehab.  She like to hold off on this for now though she will let us  know if she changes her mind.  Has Zofran  to use as needed at home as well.  Does not need refill today.

## 2023-12-24 NOTE — Addendum Note (Signed)
 Addended by: IDA ELORA HERO on: 12/24/2023 10:32 AM   Modules accepted: Orders

## 2023-12-24 NOTE — Patient Instructions (Signed)
 It was very nice to see you today!  We will check blood work today.  Please continue to work on diet and exercise.  Will get your HPV vaccine today.  Will complete your paperwork.  I will see you back next year for your annual physical.  Please come back sooner if needed.  Return in about 1 year (around 12/23/2024) for Annual Physical.   Take care, Dr Kennyth  PLEASE NOTE:  If you had any lab tests, please let us  know if you have not heard back within a few days. You may see your results on mychart before we have a chance to review them but we will give you a call once they are reviewed by us .   If we ordered any referrals today, please let us  know if you have not heard from their office within the next week.   If you had any urgent prescriptions sent in today, please check with the pharmacy within an hour of our visit to make sure the prescription was transmitted appropriately.   Please try these tips to maintain a healthy lifestyle:  Eat at least 3 REAL meals and 1-2 snacks per day.  Aim for no more than 5 hours between eating.  If you eat breakfast, please do so within one hour of getting up.   Each meal should contain half fruits/vegetables, one quarter protein, and one quarter carbs (no bigger than a computer mouse)  Cut down on sweet beverages. This includes juice, soda, and sweet tea.   Drink at least 1 glass of water with each meal and aim for at least 8 glasses per day  Exercise at least 150 minutes every week.    Preventive Care 16-80 Years Old, Female Preventive care refers to lifestyle choices and visits with your health care provider that can promote health and wellness. Preventive care visits are also called wellness exams. What can I expect for my preventive care visit? Counseling During your preventive care visit, your health care provider may ask about your: Medical history, including: Past medical problems. Family medical history. Pregnancy history. Current  health, including: Menstrual cycle. Method of birth control. Emotional well-being. Home life and relationship well-being. Sexual activity and sexual health. Lifestyle, including: Alcohol, nicotine or tobacco, and drug use. Access to firearms. Diet, exercise, and sleep habits. Work and work Astronomer. Sunscreen use. Safety issues such as seatbelt and bike helmet use. Physical exam Your health care provider may check your: Height and weight. These may be used to calculate your BMI (body mass index). BMI is a measurement that tells if you are at a healthy weight. Waist circumference. This measures the distance around your waistline. This measurement also tells if you are at a healthy weight and may help predict your risk of certain diseases, such as type 2 diabetes and high blood pressure. Heart rate and blood pressure. Body temperature. Skin for abnormal spots. What immunizations do I need?  Vaccines are usually given at various ages, according to a schedule. Your health care provider will recommend vaccines for you based on your age, medical history, and lifestyle or other factors, such as travel or where you work. What tests do I need? Screening Your health care provider may recommend screening tests for certain conditions. This may include: Pelvic exam and Pap test. Lipid and cholesterol levels. Diabetes screening. This is done by checking your blood sugar (glucose) after you have not eaten for a while (fasting). Hepatitis B test. Hepatitis C test. HIV (human immunodeficiency virus)  test. STI (sexually transmitted infection) testing, if you are at risk. BRCA-related cancer screening. This may be done if you have a family history of breast, ovarian, tubal, or peritoneal cancers. Talk with your health care provider about your test results, treatment options, and if necessary, the need for more tests. Follow these instructions at home: Eating and drinking  Eat a healthy diet  that includes fresh fruits and vegetables, whole grains, lean protein, and low-fat dairy products. Take vitamin and mineral supplements as recommended by your health care provider. Do not drink alcohol if: Your health care provider tells you not to drink. You are pregnant, may be pregnant, or are planning to become pregnant. If you drink alcohol: Limit how much you have to 0-1 drink a day. Know how much alcohol is in your drink. In the [place] one drink equals one 12 oz bottle of beer (355 mL), one 5 oz glass of wine (148 mL), or one 1 oz glass of hard liquor (44 mL). Lifestyle Brush your teeth every morning and night with fluoride toothpaste. Floss one time each day. Exercise for at least 30 minutes 5 or more days each week. Do not use any products that contain nicotine or tobacco. These products include cigarettes, chewing tobacco, and vaping devices, such as e-cigarettes. If you need help quitting, ask your health care provider. Do not use drugs. If you are sexually active, practice safe sex. Use a condom or other form of protection to prevent STIs. If you do not wish to become pregnant, use a form of birth control. If you plan to become pregnant, see your health care provider for a prepregnancy visit. Find healthy ways to manage stress, such as: Meditation, yoga, or listening to music. Journaling. Talking to a trusted person. Spending time with friends and family. Minimize exposure to UV radiation to reduce your risk of skin cancer. Safety Always wear your seat belt while driving or riding in a vehicle. Do not drive: If you have been drinking alcohol. Do not ride with someone who has been drinking. If you have been using any mind-altering substances or drugs. While texting. When you are tired or distracted. Wear a helmet and other protective equipment during sports activities. If you have firearms in your house, make sure you follow all gun safety procedures. Seek help if you have  been physically or sexually abused. What's next? Go to your health care provider once a year for an annual wellness visit. Ask your health care provider how often you should have your eyes and teeth checked. Stay up to date on all vaccines. This information is not intended to replace advice given to you by your health care provider. Make sure you discuss any questions you have with your health care provider. Document Revised: 11/14/2020 Document Reviewed: 11/14/2020 Elsevier Patient Education  2024 [employer].

## 2023-12-24 NOTE — Assessment & Plan Note (Signed)
 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  She does occasionally get painful varicosity along medial aspect of right calf.  Symptoms are currently management.  We did discuss conservative measures including leg elevation and compression.  We discussed referral to vascular surgery however she would like to hold off on this for now.

## 2023-12-28 ENCOUNTER — Ambulatory Visit: Payer: Self-pay | Admitting: Family Medicine

## 2023-12-28 NOTE — Progress Notes (Signed)
 A1c is up a little bit but otherwise labs are all stable.  Do not need to make any changes to treatment plan.  Should continue to work on diet and exercise and we can recheck again in a year or so.

## 2024-01-26 ENCOUNTER — Ambulatory Visit (INDEPENDENT_AMBULATORY_CARE_PROVIDER_SITE_OTHER)

## 2024-01-26 DIAGNOSIS — Z23 Encounter for immunization: Secondary | ICD-10-CM | POA: Diagnosis not present

## 2024-01-26 NOTE — Progress Notes (Signed)
 Patient is in office today for a nurse visit for Immunization, per PCP's order. Patient Injection was given in the  Right deltoid. Patient tolerated injection well.

## 2024-03-25 ENCOUNTER — Other Ambulatory Visit: Payer: Self-pay

## 2024-03-25 MED ORDER — COVID-19 MRNA VAC-TRIS(PFIZER) 30 MCG/0.3ML IM SUSY
0.3000 mL | PREFILLED_SYRINGE | Freq: Once | INTRAMUSCULAR | 0 refills | Status: AC
  Filled 2024-03-25: qty 0.3, 1d supply, fill #0

## 2024-07-28 ENCOUNTER — Ambulatory Visit

## 2024-08-04 ENCOUNTER — Ambulatory Visit: Admitting: Dermatology
